# Patient Record
Sex: Female | Born: 1965 | Race: Black or African American | Hispanic: No | Smoking: Never smoker
Health system: Southern US, Community
[De-identification: ages and names within clinical notes are randomized; demographics above are authoritative.]

## PROBLEM LIST (undated history)

## (undated) DIAGNOSIS — K219 Gastro-esophageal reflux disease without esophagitis: Secondary | ICD-10-CM

## (undated) DIAGNOSIS — R0600 Dyspnea, unspecified: Secondary | ICD-10-CM

## (undated) DIAGNOSIS — R7303 Prediabetes: Secondary | ICD-10-CM

## (undated) DIAGNOSIS — M1990 Unspecified osteoarthritis, unspecified site: Secondary | ICD-10-CM

## (undated) DIAGNOSIS — F419 Anxiety disorder, unspecified: Secondary | ICD-10-CM

## (undated) DIAGNOSIS — J45909 Unspecified asthma, uncomplicated: Secondary | ICD-10-CM

## (undated) HISTORY — PX: DILATION AND CURETTAGE OF UTERUS: SHX78

## (undated) HISTORY — PX: TUBAL LIGATION: SHX77

## (undated) HISTORY — PX: APPENDECTOMY: SHX54

## (undated) HISTORY — PX: TOTAL HIP ARTHROPLASTY: SHX124

---

## 2002-03-26 ENCOUNTER — Encounter: Payer: Self-pay | Admitting: Emergency Medicine

## 2002-03-26 ENCOUNTER — Emergency Department (HOSPITAL_COMMUNITY): Admission: EM | Admit: 2002-03-26 | Discharge: 2002-03-26 | Payer: Self-pay | Admitting: Emergency Medicine

## 2002-04-26 ENCOUNTER — Ambulatory Visit (HOSPITAL_COMMUNITY): Admission: RE | Admit: 2002-04-26 | Discharge: 2002-04-26 | Payer: Self-pay | Admitting: Pulmonary Disease

## 2002-04-26 ENCOUNTER — Encounter: Payer: Self-pay | Admitting: Pulmonary Disease

## 2003-10-25 ENCOUNTER — Inpatient Hospital Stay (HOSPITAL_COMMUNITY): Admission: RE | Admit: 2003-10-25 | Discharge: 2003-10-29 | Payer: Self-pay | Admitting: Orthopedic Surgery

## 2004-07-12 ENCOUNTER — Encounter: Admission: RE | Admit: 2004-07-12 | Discharge: 2004-07-12 | Payer: Self-pay | Admitting: Orthopedic Surgery

## 2004-07-23 ENCOUNTER — Ambulatory Visit (HOSPITAL_COMMUNITY): Admission: RE | Admit: 2004-07-23 | Discharge: 2004-07-23 | Payer: Self-pay | Admitting: Orthopedic Surgery

## 2015-03-14 ENCOUNTER — Other Ambulatory Visit (HOSPITAL_COMMUNITY): Payer: Self-pay | Admitting: Orthopaedic Surgery

## 2015-03-14 DIAGNOSIS — M25552 Pain in left hip: Secondary | ICD-10-CM

## 2015-03-22 ENCOUNTER — Ambulatory Visit (HOSPITAL_COMMUNITY): Payer: BLUE CROSS/BLUE SHIELD

## 2015-03-22 ENCOUNTER — Encounter (HOSPITAL_COMMUNITY): Payer: BLUE CROSS/BLUE SHIELD

## 2015-03-31 ENCOUNTER — Ambulatory Visit (HOSPITAL_COMMUNITY): Admission: RE | Admit: 2015-03-31 | Payer: BLUE CROSS/BLUE SHIELD | Source: Ambulatory Visit

## 2015-03-31 ENCOUNTER — Ambulatory Visit (HOSPITAL_COMMUNITY): Payer: BLUE CROSS/BLUE SHIELD

## 2016-03-15 ENCOUNTER — Encounter (HOSPITAL_COMMUNITY): Payer: Self-pay | Admitting: Emergency Medicine

## 2016-03-15 ENCOUNTER — Emergency Department (HOSPITAL_COMMUNITY)
Admission: EM | Admit: 2016-03-15 | Discharge: 2016-03-15 | Disposition: A | Discharge status: Home / Self Care | Payer: BLUE CROSS/BLUE SHIELD | Attending: Emergency Medicine | Admitting: Emergency Medicine

## 2016-03-15 DIAGNOSIS — J3489 Other specified disorders of nose and nasal sinuses: Secondary | ICD-10-CM | POA: Insufficient documentation

## 2016-03-15 DIAGNOSIS — J45909 Unspecified asthma, uncomplicated: Secondary | ICD-10-CM | POA: Insufficient documentation

## 2016-03-15 HISTORY — DX: Unspecified asthma, uncomplicated: J45.909

## 2016-03-15 HISTORY — DX: Anxiety disorder, unspecified: F41.9

## 2016-03-15 MED ORDER — SULFAMETHOXAZOLE-TRIMETHOPRIM 800-160 MG PO TABS: 1.0000 | ORAL_TABLET | Freq: Two times a day (BID) | ORAL | Status: DC

## 2016-03-15 MED ORDER — SULFAMETHOXAZOLE-TRIMETHOPRIM 800-160 MG PO TABS
1.0000 | ORAL_TABLET | Freq: Once | ORAL | Status: AC
  Administered 2016-03-15: 1 via ORAL
  Filled 2016-03-15: qty 1

## 2016-03-15 MED ORDER — HYDROCODONE-ACETAMINOPHEN 5-325 MG PO TABS
1.0000 | ORAL_TABLET | Freq: Once | ORAL | Status: AC
  Administered 2016-03-15: 1 via ORAL
  Filled 2016-03-15: qty 1

## 2016-03-15 MED ORDER — MUPIROCIN CALCIUM 2 % EX CREA
TOPICAL_CREAM | Freq: Once | CUTANEOUS | Status: AC
  Administered 2016-03-15: 02:00:00 via TOPICAL
  Filled 2016-03-15: qty 15

## 2016-03-15 MED ORDER — MUPIROCIN CALCIUM 2 % EX CREA: TOPICAL_CREAM | Freq: Three times a day (TID) | CUTANEOUS | Status: DC

## 2016-03-15 MED ORDER — HYDROCODONE-ACETAMINOPHEN 5-325 MG PO TABS: 1.0000 | ORAL_TABLET | Freq: Four times a day (QID) | ORAL | Status: DC | PRN

## 2016-03-15 NOTE — Discharge Instructions (Signed)
Apply the Bactroban ointment to the inside of your right there 3 times a day.  Take the Bactrim as directed twice a day.  Please call ENT to make an appointment for follow-up

## 2016-03-15 NOTE — ED Provider Notes (Signed)
CSN: [id number]     Arrival date & time 03/15/16  0000 History  By signing my name below, I, [facility], attest that this documentation has been prepared under the direction and in the presence of Junius Creamer, NP. Electronically Signed: Virgel Bouquet, ED Scribe. 03/15/2016. 1:37 AM.    Chief Complaint  Patient presents with  . Facial Pain    The history is provided by the patient. No language interpreter was used.   HPI Comments: Karen Reese is a 50 y.o. female with a hx of asthma and anxiety who presents to the Emergency Department complaining of constant, mild, unchanging facial pain onset 2 weeks ago. Pt states that for the past month she has had pain, bleeding, and scaling skin to her nose for which she was evaluated by her PCP, diagnosed with dry mucosa, and advised to apply Vaseline which resolved her symptoms. She reports that she was asymptomatic until several weeks ago when she felt an insect fly up her left nare followed by a stinging pain and then by gradual facial pain in her nose, behind her bilateral eyes, and down to her upper lip. Pain is worse with palpation of the nose. She has applied warm compresses and taken OTC medications without relief. Denies fevers.   Past Medical History  Diagnosis Date  . Asthma   . Anxiety    Past Surgical History  Procedure Laterality Date  . Total hip arthroplasty    . Appendectomy    . Dilation and curettage of uterus    . Tubal ligation     Family History  Problem Relation Age of Onset  . Hypertension Other    Social History  Substance Use Topics  . Smoking status: Never Smoker   . Smokeless tobacco: None  . Alcohol Use: Yes     Comment: social   OB History    No data available     Review of Systems  Constitutional: Negative for fever.  Musculoskeletal: Positive for myalgias (facial pain).      Allergies  Review of patient's allergies indicates no known allergies.  Home Medications   Prior to  Admission medications   Medication Sig Start Date End Date Taking? Authorizing Provider  HYDROcodone-acetaminophen (NORCO/VICODIN) 5-325 MG tablet Take 1 tablet by mouth every 6 (six) hours as needed for moderate pain. 03/15/16   Junius Creamer, NP  mupirocin cream (BACTROBAN) 2 % Apply topically 3 (three) times daily. Inside R nare 03/15/16   Junius Creamer, NP  sulfamethoxazole-trimethoprim (BACTRIM DS,SEPTRA DS) 800-160 MG tablet Take 1 tablet by mouth 2 (two) times daily. 03/15/16   Junius Creamer, NP   BP 167/109 mmHg  Pulse 87  Temp(Src) 99.3 F (37.4 C) (Oral)  Resp 20  SpO2 100%  LMP  Physical Exam  Constitutional: She is oriented to person, place, and time. She appears well-developed and well-nourished. No distress.  HENT:  Head: Normocephalic and atraumatic.  3.5 x 5.5 mm swollen area in the right nare  Eyes: Conjunctivae are normal.  Neck: Normal range of motion.  Cardiovascular: Normal rate.   Pulmonary/Chest: Effort normal. No respiratory distress.  Musculoskeletal: Normal range of motion.  Neurological: She is alert and oriented to person, place, and time.  Skin: Skin is warm and dry.  Psychiatric: She has a normal mood and affect. Her behavior is normal.  Nursing note and vitals reviewed.   ED Course  .Marland KitchenIncision and Drainage Date/Time: 03/15/2016 2:05 AM Performed by: Junius Creamer Authorized by: Junius Creamer Consent:  Verbal consent obtained. Written consent not obtained. Risks and benefits: risks, benefits and alternatives were discussed Consent given by: patient Patient understanding: patient states understanding of the procedure being performed Patient identity confirmed: verbally with patient Time out: Immediately prior to procedure a "time out" was called to verify the correct patient, procedure, equipment, support staff and site/side marked as required. Type: cyst Location: nostril. Anesthetic total: 0 ml Patient sedated: no Needle gauge: 20 Complexity:  simple Drainage amount: scant Comments: Very small amount purulent fluid and blood      DIAGNOSTIC STUDIES: Oxygen Saturation is 100% on RA, normal by my interpretation.    COORDINATION OF CARE: 1:17 AM Will attempt to aspirate swollen area in right nare. Will prescribe antibiotic. Discussed treatment plan with pt at bedside and pt agreed to plan.  1:28 AM Returned to aspirate swollen area in right nare. Will order Bactroban, Bactrim, and Norco. Will provide pt with referral to an ENT specialist. Advised pt to follow-up with ENT specialist. Will discharge pt. We will start patient on Septra and Bactroban topically and have patient follow-up with ENT MDM   Final diagnoses:  Nasal cavity mass    I personally performed the services described in this documentation, which was scribed in my presence. The recorded information has been reviewed and is accurate.   Junius Creamer, NP 03/15/16 [place], MD 03/15/16 [phone]

## 2016-03-15 NOTE — ED Notes (Signed)
Pt states for the past month she has had nasal irritation  Pt states she was seen by her dr and was told her nose was probably dry and to use vasoline   Pt states then she was outside and something went up her nose  Pt states since she has had facial pain down into her teeth, in her ears, behind her eyes  Pt states she has used warm compresses and OTC meds

## 2016-04-10 ENCOUNTER — Ambulatory Visit: Payer: BLUE CROSS/BLUE SHIELD

## 2016-08-31 ENCOUNTER — Encounter (HOSPITAL_COMMUNITY): Payer: Self-pay | Admitting: Emergency Medicine

## 2016-08-31 ENCOUNTER — Emergency Department (HOSPITAL_COMMUNITY)
Admission: EM | Admit: 2016-08-31 | Discharge: 2016-08-31 | Disposition: A | Discharge status: Home / Self Care | Payer: Self-pay | Attending: Emergency Medicine | Admitting: Emergency Medicine

## 2016-08-31 ENCOUNTER — Emergency Department (HOSPITAL_COMMUNITY): Payer: Self-pay

## 2016-08-31 DIAGNOSIS — Z96649 Presence of unspecified artificial hip joint: Secondary | ICD-10-CM | POA: Insufficient documentation

## 2016-08-31 DIAGNOSIS — R059 Cough, unspecified: Secondary | ICD-10-CM

## 2016-08-31 DIAGNOSIS — J45909 Unspecified asthma, uncomplicated: Secondary | ICD-10-CM | POA: Insufficient documentation

## 2016-08-31 DIAGNOSIS — R05 Cough: Secondary | ICD-10-CM | POA: Insufficient documentation

## 2016-08-31 MED ORDER — ALBUTEROL SULFATE HFA 108 (90 BASE) MCG/ACT IN AERS
2.0000 | INHALATION_SPRAY | Freq: Once | RESPIRATORY_TRACT | Status: AC
  Administered 2016-08-31: 2 via RESPIRATORY_TRACT
  Filled 2016-08-31: qty 6.7

## 2016-08-31 MED ORDER — BENZONATATE 100 MG PO CAPS: 100.0000 mg | ORAL_CAPSULE | Freq: Three times a day (TID) | ORAL | 0 refills | Status: DC

## 2016-08-31 NOTE — Discharge Instructions (Signed)
Take the prescribed medication as directed. °Follow-up with your primary care doctor. °Return to the ED for new or worsening symptoms. °

## 2016-08-31 NOTE — ED Triage Notes (Addendum)
Patient c/o cough with greenish phlegm x week. patient has central chest pain with cough and deep breath. Patient also reports body aches and nasal congestion. Patient states that she is prone to getting bronchitis.

## 2016-08-31 NOTE — ED Provider Notes (Signed)
[facility] DEPT Provider Note   CSN: [id number] Arrival date & time: 08/31/16  1408  By signing my name below, I, Higinio Plan, attest that this documentation has been prepared under the direction and in the presence of Quincy Carnes, PA-C.  Electronically Signed: Higinio Plan, ED Scribe. 08/31/16. 2:42 PM.  History   Chief Complaint Chief Complaint  Patient presents with  . Cough  . Chest Pain  . Nasal Congestion   The history is provided by the patient. No language interpreter was used.   HPI Comments: CARESA KAMPMAN is a 50 y.o. female who presents to the Emergency Department complaining of gradually worsening, cough productive of green phlegm that began ~1 week ago. Pt reports associated subjective fever, nasal congestion, generalized body aches and central chest pain secondary to her cough. She notes her pain is exacerbated when taking a deep breath. 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 She denies hx of cardiac issues.   She is not a smoker.  Past Medical History:  Diagnosis Date  . Anxiety   . Asthma    There are no active problems to display for this patient.  Past Surgical History:  Procedure Laterality Date  . APPENDECTOMY    . DILATION AND CURETTAGE OF UTERUS    . TOTAL HIP ARTHROPLASTY    . TUBAL LIGATION      OB History    No data available     Home Medications    Prior to Admission medications   Medication Sig Start Date End Date Taking? Authorizing Provider  HYDROcodone-acetaminophen (NORCO/VICODIN) 5-325 MG tablet Take 1 tablet by mouth every 6 (six) hours as needed for moderate pain. 03/15/16   Junius Creamer, NP  mupirocin cream (BACTROBAN) 2 % Apply topically 3 (three) times daily. Inside R nare 03/15/16   Junius Creamer, NP  sulfamethoxazole-trimethoprim (BACTRIM  DS,SEPTRA DS) 800-160 MG tablet Take 1 tablet by mouth 2 (two) times daily. 03/15/16   Junius Creamer, NP    Family History Family History  Problem Relation Age of Onset  . Hypertension Other     Social History Social History  Substance Use Topics  . Smoking status: Never Smoker  . Smokeless tobacco: Never Used  . Alcohol use Yes     Comment: social     Allergies   Patient has no known allergies.   Review of Systems Review of Systems  Constitutional: Positive for fever.  HENT: Positive for congestion.   Respiratory: Positive for cough.   Cardiovascular: Positive for chest pain (secondary to cough).  Musculoskeletal: Positive for myalgias.  All other systems reviewed and are negative.  Physical Exam Updated Vital Signs BP 129/89 (BP Location: Left Arm)   Pulse 90   Temp 98.7 F (37.1 C) (Oral)   Resp 18   SpO2 98%   Physical Exam  Constitutional: She is oriented to person, place, and time. She appears well-developed and well-nourished.  HENT:  Head: Normocephalic and atraumatic.  Right Ear: Tympanic membrane normal.  Left Ear: Tympanic membrane normal.  Nose: Nose normal.  Mouth/Throat: Uvula is midline, oropharynx is clear and moist and mucous membranes are normal.  Eyes: Conjunctivae and EOM are normal. Pupils are equal, round, and reactive to light.  Neck: Normal range of motion.  Cardiovascular: Normal rate, regular rhythm and normal heart  sounds.   Pulmonary/Chest: Effort normal and breath sounds normal. She has no wheezes. She has no rhonchi. She has no rales.  Wet sounding cough noted on exam, no production, lungs overall clear without wheezes or rhonchi, no distress  Abdominal: Soft. Bowel sounds are normal.  Musculoskeletal: Normal range of motion.  Neurological: She is alert and oriented to person, place, and time.  Skin: Skin is warm and dry.  Psychiatric: She has a normal mood and affect.  Nursing note and vitals reviewed.  ED Treatments / Results    Labs (all labs ordered are listed, but only abnormal results are displayed) Labs Reviewed - No data to display  EKG  EKG Interpretation None       Radiology No results found.  Procedures Procedures (including critical care time)  Medications Ordered in ED Medications - No data to display  DIAGNOSTIC STUDIES:  Oxygen Saturation is 98% on RA, normal by my interpretation.    COORDINATION OF CARE:  2:38 PM Discussed treatment plan with pt at bedside and pt agreed to plan.  Initial Impression / Assessment and Plan / ED Course  I have reviewed the triage vital signs and the nursing notes.  Pertinent labs & imaging results that were available during my care of the patient were reviewed by me and considered in my medical decision making (see chart for details).  Clinical Course    50 year old female here with productive cough for the past week. Reports history of recurrent bronchitis. 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 She is afebrile and nontoxic. She does have a wet sounding cough on exam without production.  She does report some chest pain, but only with coughing. Denies shortness of breath. She has no known cardiac history, she is not a smoker.  Chest x-ray was obtained, no acute findings. Patient's vital signs have been stable. Feel this is likely viral process, possibly recurrent bronchitis. Feel ACS, PE, dissection, or other acute cardiac event is less likely. She was given albuterol inhaler here, discharge home with Tessalon Perles for cough. Recommended supportive care, follow-up with PCP.  Discussed plan with patient, she acknowledged understanding and agreed with plan of care.  Return precautions given for new or worsening symptoms.  I personally performed the services described in this documentation, which was scribed in my presence. The recorded information has been reviewed and is accurate.  Final Clinical Impressions(s) / ED  Diagnoses   Final diagnoses:  Cough    New Prescriptions Discharge Medication List as of 08/31/2016  3:53 PM    START taking these medications   Details  benzonatate (TESSALON) 100 MG capsule Take 1 capsule (100 mg total) by mouth every 8 (eight) hours., Starting Sat 08/31/2016, Print         NARCISSUS MACDERMOTT, PA-C 08/31/16 1614    Quintella Reichert, MD 09/03/16 [phone]

## 2017-02-04 ENCOUNTER — Encounter (HOSPITAL_COMMUNITY): Payer: Self-pay | Admitting: Emergency Medicine

## 2017-02-04 DIAGNOSIS — J45909 Unspecified asthma, uncomplicated: Secondary | ICD-10-CM | POA: Insufficient documentation

## 2017-02-04 DIAGNOSIS — Z96649 Presence of unspecified artificial hip joint: Secondary | ICD-10-CM | POA: Insufficient documentation

## 2017-02-04 DIAGNOSIS — Z79899 Other long term (current) drug therapy: Secondary | ICD-10-CM | POA: Insufficient documentation

## 2017-02-04 DIAGNOSIS — R490 Dysphonia: Secondary | ICD-10-CM | POA: Insufficient documentation

## 2017-02-04 NOTE — ED Triage Notes (Signed)
Pt states she has had a hoarse voice for the past couple months  Pt states she thought it was allergies so she used some benadryl  Pt states it seems like it is getting better but then it comes back

## 2017-02-05 ENCOUNTER — Emergency Department (HOSPITAL_COMMUNITY)
Admission: EM | Admit: 2017-02-05 | Discharge: 2017-02-05 | Disposition: A | Discharge status: Home / Self Care | Payer: BLUE CROSS/BLUE SHIELD | Attending: Emergency Medicine | Admitting: Emergency Medicine

## 2017-02-05 DIAGNOSIS — R490 Dysphonia: Secondary | ICD-10-CM

## 2017-02-05 HISTORY — DX: Prediabetes: R73.03

## 2017-02-05 NOTE — ED Provider Notes (Signed)
[name]   CSN: [id number] Arrival date & time: 02/04/17  2336     History   Chief Complaint Chief Complaint  Patient presents with  . Hoarse    HPI Karen Reese is a 51 y.o. female.  The history is provided by the patient and medical records. No language interpreter was used.   Karen Reese is a 51 y.o. female  with a PMH of asthma, prediabetes and anxiety who presents to the Emergency Department complaining of hoarseness x 4 months. Patient states this has been a persistent problem. Will wax-and-wane but has not ever resolved. Initially thought it was her allergies. She tried benadryl but with no relief. No cough, congestion, fever, chills. No dysphagia or sore throat. No shortness of breath. No alleviating or aggravating factors noted.   Past Medical History:  Diagnosis Date  . Anxiety   . Asthma   . Prediabetes     There are no active problems to display for this patient.   Past Surgical History:  Procedure Laterality Date  . APPENDECTOMY    . DILATION AND CURETTAGE OF UTERUS    . TOTAL HIP ARTHROPLASTY    . TUBAL LIGATION      OB History    No data available       Home Medications    Prior to Admission medications   Medication Sig Start Date End Date Taking? Authorizing Provider  benzonatate (TESSALON) 100 MG capsule Take 1 capsule (100 mg total) by mouth every 8 (eight) hours. 08/31/16   Larene Pickett, PA-C  HYDROcodone-acetaminophen (NORCO/VICODIN) 5-325 MG tablet Take 1 tablet by mouth every 6 (six) hours as needed for moderate pain. 03/15/16   Junius Creamer, NP  mupirocin cream (BACTROBAN) 2 % Apply topically 3 (three) times daily. Inside R nare 03/15/16   Junius Creamer, NP  sulfamethoxazole-trimethoprim (BACTRIM DS,SEPTRA DS) 800-160 MG tablet Take 1 tablet by mouth 2 (two) times daily. 03/15/16   Junius Creamer, NP    Family History Family History  Problem Relation Age of Onset  . Hypertension Other     Social  History Social History  Substance Use Topics  . Smoking status: Never Smoker  . Smokeless tobacco: Never Used  . Alcohol use Yes     Comment: social     Allergies   Bee venom   Review of Systems Review of Systems  Constitutional: Negative for chills and fever.  HENT: Positive for voice change. Negative for congestion, sneezing, sore throat and trouble swallowing.        + hoarseness  Eyes: Negative for discharge and itching.  Respiratory: Negative for cough and shortness of breath.   Cardiovascular: Negative for chest pain.     Physical Exam Updated Vital Signs BP 132/82 (BP Location: Left Arm)   Pulse 76   Temp 98.7 F (37.1 C) (Oral)   Resp 20   LMP 01/21/2017 (Approximate)   SpO2 100%   Physical Exam  Constitutional: She appears well-developed and well-nourished. No distress.  HENT:  Head: Normocephalic and atraumatic.  Nose: Nose normal.  Mouth/Throat: Oropharynx is clear and moist.  OP with no erythema, tonsillar hypertrophy or exudates. No sinus tenderness.  Neck: Neck supple. No tracheal deviation present. No thyromegaly present.  Cardiovascular: Normal rate, regular rhythm and normal heart sounds.   No murmur heard. Pulmonary/Chest: Effort normal and breath sounds normal. No respiratory distress. She has no wheezes. She has no rales.  Musculoskeletal: Normal range of motion.  Neurological: She is alert.  Skin: Skin is warm and dry.  Nursing note and vitals reviewed.    ED Treatments / Results  Labs (all labs ordered are listed, but only abnormal results are displayed) Labs Reviewed - No data to display  EKG  EKG Interpretation None       Radiology No results found.  Procedures Procedures (including critical care time)  Medications Ordered in ED Medications - No data to display   Initial Impression / Assessment and Plan / ED Course  I have reviewed the triage vital signs and the nursing notes.  Pertinent labs & imaging results that  were available during my care of the patient were reviewed by me and considered in my medical decision making (see chart for details).     Karen Reese is a 51 y.o. female who presents to ED for hoarseness x 4 months. No other associated symptoms. Benign exam with normal thyroid and OP. No neck tenderness and neck with full ROM. Evaluation does not show pathology that would require ongoing emergent intervention or inpatient treatment. Patient referred to ENT for further workup. Understands reasons to return including inability to tolerate fluids, dysphagia, fevers, new/worsening symptoms or additional concerns. All questions answered.   Patient discussed with Dr. Christy Gentles who agrees with treatment plan.    Final Clinical Impressions(s) / ED Diagnoses   Final diagnoses:  Hoarseness    New Prescriptions Discharge Medication List as of 02/05/2017  1:25 AM       Chaniyah Jahr, Ozella Almond, PA-C 02/05/17 1652    Ripley Fraise, MD 02/05/17 2219

## 2017-02-05 NOTE — Discharge Instructions (Signed)
It was my pleasure taking care of you today!   Please call the ENT (ear, nose, throat) doctor listed in the morning to schedule a follow up appointment.   Return to ER for painful swallowing, unable to keep fluids down, fevers, new or worsening symptoms, any additional concerns.

## 2017-05-10 ENCOUNTER — Emergency Department (HOSPITAL_COMMUNITY): Payer: BLUE CROSS/BLUE SHIELD

## 2017-05-10 ENCOUNTER — Emergency Department (HOSPITAL_COMMUNITY)
Admission: EM | Admit: 2017-05-10 | Discharge: 2017-05-10 | Disposition: A | Discharge status: Home / Self Care | Payer: BLUE CROSS/BLUE SHIELD | Attending: Emergency Medicine | Admitting: Emergency Medicine

## 2017-05-10 ENCOUNTER — Encounter (HOSPITAL_COMMUNITY): Payer: Self-pay | Admitting: Emergency Medicine

## 2017-05-10 DIAGNOSIS — J45909 Unspecified asthma, uncomplicated: Secondary | ICD-10-CM | POA: Insufficient documentation

## 2017-05-10 DIAGNOSIS — E876 Hypokalemia: Secondary | ICD-10-CM

## 2017-05-10 DIAGNOSIS — K529 Noninfective gastroenteritis and colitis, unspecified: Secondary | ICD-10-CM | POA: Insufficient documentation

## 2017-05-10 DIAGNOSIS — K6389 Other specified diseases of intestine: Secondary | ICD-10-CM

## 2017-05-10 DIAGNOSIS — R1084 Generalized abdominal pain: Secondary | ICD-10-CM

## 2017-05-10 DIAGNOSIS — R110 Nausea: Secondary | ICD-10-CM

## 2017-05-10 DIAGNOSIS — Z96649 Presence of unspecified artificial hip joint: Secondary | ICD-10-CM | POA: Insufficient documentation

## 2017-05-10 DIAGNOSIS — Z79899 Other long term (current) drug therapy: Secondary | ICD-10-CM | POA: Insufficient documentation

## 2017-05-10 DIAGNOSIS — D259 Leiomyoma of uterus, unspecified: Secondary | ICD-10-CM

## 2017-05-10 LAB — CBC
HCT: 34.9 % — ABNORMAL LOW (ref 36.0–46.0)
Hemoglobin: 11.8 g/dL — ABNORMAL LOW (ref 12.0–15.0)
MCH: 30.9 pg (ref 26.0–34.0)
MCHC: 33.8 g/dL (ref 30.0–36.0)
MCV: 91.4 fL (ref 78.0–100.0)
Platelets: 289 10*3/uL (ref 150–400)
RBC: 3.82 MIL/uL — ABNORMAL LOW (ref 3.87–5.11)
RDW: 14.4 % (ref 11.5–15.5)
WBC: 11.4 10*3/uL — ABNORMAL HIGH (ref 4.0–10.5)

## 2017-05-10 LAB — COMPREHENSIVE METABOLIC PANEL
ALT: 15 U/L (ref 14–54)
AST: 17 U/L (ref 15–41)
Albumin: 3.4 g/dL — ABNORMAL LOW (ref 3.5–5.0)
Alkaline Phosphatase: 42 U/L (ref 38–126)
Anion gap: 7 (ref 5–15)
BUN: 10 mg/dL (ref 6–20)
CO2: 25 mmol/L (ref 22–32)
CREATININE: 0.78 mg/dL (ref 0.44–1.00)
Calcium: 8.7 mg/dL — ABNORMAL LOW (ref 8.9–10.3)
Chloride: 105 mmol/L (ref 101–111)
GFR calc Af Amer: >60 mL/min (ref >60)
GFR calc non Af Amer: >60 mL/min (ref >60)
Glucose, Bld: 93 mg/dL (ref 65–99)
Potassium: 3.1 mmol/L — ABNORMAL LOW (ref 3.5–5.1)
Sodium: 137 mmol/L (ref 135–145)
Total Bilirubin: 0.7 mg/dL (ref 0.3–1.2)
Total Protein: 7.8 g/dL (ref 6.5–8.1)

## 2017-05-10 LAB — URINALYSIS, ROUTINE W REFLEX MICROSCOPIC
BACTERIA UA: NONE SEEN
Bilirubin Urine: NEGATIVE
Glucose, UA: NEGATIVE mg/dL
Ketones, ur: NEGATIVE mg/dL
LEUKOCYTES UA: NEGATIVE
Nitrite: NEGATIVE
Protein, ur: NEGATIVE mg/dL
Specific Gravity, Urine: 1.027 (ref 1.005–1.030)
pH: 5 (ref 5.0–8.0)

## 2017-05-10 LAB — POC URINE PREG, ED: Preg Test, Ur: NEGATIVE

## 2017-05-10 LAB — LIPASE, BLOOD: Lipase: 22 U/L (ref 11–51)

## 2017-05-10 MED ORDER — PROMETHAZINE HCL 25 MG PO TABS: 25.0000 mg | ORAL_TABLET | Freq: Four times a day (QID) | ORAL | 0 refills | Status: DC | PRN

## 2017-05-10 MED ORDER — HYDROCODONE-ACETAMINOPHEN 5-325 MG PO TABS: 1.0000 | ORAL_TABLET | ORAL | 0 refills | Status: DC | PRN

## 2017-05-10 MED ORDER — ONDANSETRON HCL 4 MG/2ML IJ SOLN
4.0000 mg | Freq: Once | INTRAMUSCULAR | Status: AC
  Administered 2017-05-10: 4 mg via INTRAVENOUS
  Filled 2017-05-10: qty 2

## 2017-05-10 MED ORDER — POTASSIUM CHLORIDE CRYS ER 20 MEQ PO TBCR
20.0000 meq | EXTENDED_RELEASE_TABLET | Freq: Once | ORAL | Status: AC
  Administered 2017-05-10: 20 meq via ORAL
  Filled 2017-05-10: qty 1

## 2017-05-10 MED ORDER — SODIUM CHLORIDE 0.9 % IV BOLUS (SEPSIS)
1000.0000 mL | Freq: Once | INTRAVENOUS | Status: AC
  Administered 2017-05-10: 1000 mL via INTRAVENOUS

## 2017-05-10 MED ORDER — IOPAMIDOL (ISOVUE-300) INJECTION 61%
INTRAVENOUS | Status: AC
  Administered 2017-05-10: 100 mL via INTRAVENOUS
  Filled 2017-05-10: qty 100

## 2017-05-10 MED ORDER — FAMOTIDINE 20 MG PO TABS: 20.0000 mg | ORAL_TABLET | Freq: Two times a day (BID) | ORAL | 0 refills | Status: AC

## 2017-05-10 MED ORDER — MORPHINE SULFATE (PF) 4 MG/ML IV SOLN
4.0000 mg | Freq: Once | INTRAVENOUS | Status: AC
  Administered 2017-05-10: 4 mg via INTRAVENOUS
  Filled 2017-05-10: qty 1

## 2017-05-10 NOTE — ED Provider Notes (Signed)
[facility] DEPT Provider Note   CSN: [id number] Arrival date & time: 05/10/17  1527     History   Chief Complaint Chief Complaint  Patient presents with  . Abdominal Pain    HPI Karen Reese is a 51 y.o. female.  Pt presents to the ED today with abdominal pain.  The pt said it is mainly in the LLQ, but is also in her epigastrium.  Sx have been going on for about 1 week.  Pt said that when the pain hits, she breaks out in a sweat.  No fevers.  She has had nausea, but no vomiting.      Past Medical History:  Diagnosis Date  . Anxiety   . Asthma   . Prediabetes     There are no active problems to display for this patient.   Past Surgical History:  Procedure Laterality Date  . APPENDECTOMY    . DILATION AND CURETTAGE OF UTERUS    . TOTAL HIP ARTHROPLASTY    . TUBAL LIGATION      OB History    No data available       Home Medications    Prior to Admission medications   Medication Sig Start Date End Date Taking? Authorizing Provider  benzonatate (TESSALON) 100 MG capsule Take 1 capsule (100 mg total) by mouth every 8 (eight) hours. 08/31/16   Larene Pickett, PA-C  famotidine (PEPCID) 20 MG tablet Take 1 tablet (20 mg total) by mouth 2 (two) times daily. 05/10/17   Isla Pence, MD  HYDROcodone-acetaminophen (NORCO/VICODIN) 5-325 MG tablet Take 1 tablet by mouth every 4 (four) hours as needed. 05/10/17   Isla Pence, MD  mupirocin cream (BACTROBAN) 2 % Apply topically 3 (three) times daily. Inside R nare 03/15/16   Junius Creamer, NP  promethazine (PHENERGAN) 25 MG tablet Take 1 tablet (25 mg total) by mouth every 6 (six) hours as needed for nausea or vomiting. 05/10/17   Isla Pence, MD  sulfamethoxazole-trimethoprim (BACTRIM DS,SEPTRA DS) 800-160 MG tablet Take 1 tablet by mouth 2 (two) times daily. 03/15/16   Junius Creamer, NP    Family History Family History  Problem Relation Age of Onset  . Hypertension Other     Social History Social History    Substance Use Topics  . Smoking status: Never Smoker  . Smokeless tobacco: Never Used  . Alcohol use Yes     Comment: social     Allergies   Bee venom   Review of Systems Review of Systems  Gastrointestinal: Positive for abdominal pain and nausea.  All other systems reviewed and are negative.    Physical Exam Updated Vital Signs BP (!) 142/88 (BP Location: Right Arm)   Pulse 82   Temp 98.3 F (36.8 C) (Oral)   Resp 16   SpO2 100%   Physical Exam  Constitutional: She is oriented to person, place, and time. She appears well-developed and well-nourished.  HENT:  Head: Normocephalic and atraumatic.  Right Ear: External ear normal.  Left Ear: External ear normal.  Nose: Nose normal.  Mouth/Throat: Oropharynx is clear and moist.  Eyes: Pupils are equal, round, and reactive to light. Conjunctivae and EOM are normal.  Neck: Normal range of motion. Neck supple.  Cardiovascular: Normal rate, regular rhythm, normal heart sounds and intact distal pulses.   Pulmonary/Chest: Effort normal and breath sounds normal.  Abdominal: Soft. Bowel sounds are normal. There is tenderness in the left lower quadrant.  Musculoskeletal: Normal range of motion.  Neurological: She is alert and oriented to person, place, and time.  Skin: Skin is warm.  Psychiatric: She has a normal mood and affect. Her behavior is normal. Judgment and thought content normal.  Nursing note and vitals reviewed.    ED Treatments / Results  Labs (all labs ordered are listed, but only abnormal results are displayed) Labs Reviewed  COMPREHENSIVE METABOLIC PANEL - Abnormal; Notable for the following:       Result Value   Potassium 3.1 (*)    Calcium 8.7 (*)    Albumin 3.4 (*)    All other components within normal limits  CBC - Abnormal; Notable for the following:    WBC 11.4 (*)    RBC 3.82 (*)    Hemoglobin 11.8 (*)    HCT 34.9 (*)    All other components within normal limits  URINALYSIS, ROUTINE W REFLEX  MICROSCOPIC - Abnormal; Notable for the following:    APPearance HAZY (*)    Hgb urine dipstick LARGE (*)    Squamous Epithelial / LPF 0-5 (*)    All other components within normal limits  LIPASE, BLOOD  POC URINE PREG, ED    EKG  EKG Interpretation None       Radiology Ct Abdomen Pelvis W Contrast  Result Date: 05/10/2017 CLINICAL DATA:  51 year old female with history of pain in the left lower quadrant for the past 2 weeks progressively worsening, now with generalized abdominal pain for the past 6 days. Nausea and sweating at home. EXAM: CT ABDOMEN AND PELVIS WITH CONTRAST TECHNIQUE: Multidetector CT imaging of the abdomen and pelvis was performed using the standard protocol following bolus administration of intravenous contrast. CONTRAST:  <See Chart> ISOVUE-300 IOPAMIDOL (ISOVUE-300) INJECTION 61% COMPARISON:  No priors. FINDINGS: Lower chest: Unremarkable. Hepatobiliary: No cystic or solid hepatic lesions. No intra or extrahepatic biliary ductal dilatation. Gallbladder is normal in appearance. Pancreas: No pancreatic mass. No pancreatic ductal dilatation. No pancreatic or peripancreatic fluid or inflammatory changes. Spleen: Unremarkable. Adrenals/Urinary Tract: Bilateral kidneys and bilateral adrenal glands are normal in appearance. No hydroureteronephrosis. Urinary bladder is normal in appearance. Stomach/Bowel: Normal appearance of the stomach. No pathologic dilatation of small bowel or colon. Adjacent to the proximal sigmoid colon there are some focal fatty attenuation areas surrounded by inflammatory changes, favored to represent epiploic appendagitis. Status post appendectomy. Vascular/Lymphatic: No significant atherosclerotic disease, aneurysm or dissection noted in the abdominal or pelvic vasculature. No lymphadenopathy noted in the abdomen or pelvis. Reproductive: 5.1 cm soft tissue mass extending from the dorsal aspect of the body of the uterus is most compatible with a large  fibroid. Ovaries are unremarkable in appearance. Other: No significant volume of ascites.  No pneumoperitoneum. Musculoskeletal: There are no aggressive appearing lytic or blastic lesions noted in the visualized portions of the skeleton. Status post left hip arthroplasty. IMPRESSION: 1. Epiploic appendagitis involving the proximal sigmoid mesocolon. 2. Additional incidental findings, as above. Electronically Signed   By: Vinnie Langton M.D.   On: 05/10/2017 20:23    Procedures Procedures (including critical care time)  Medications Ordered in ED Medications  potassium chloride SA (K-DUR,KLOR-CON) CR tablet 20 mEq (not administered)  sodium chloride 0.9 % bolus 1,000 mL (1,000 mLs Intravenous New Bag/Given 05/10/17 1934)  ondansetron (ZOFRAN) injection 4 mg (4 mg Intravenous Given 05/10/17 1934)  morphine 4 MG/ML injection 4 mg (4 mg Intravenous Given 05/10/17 1934)  iopamidol (ISOVUE-300) 61 % injection (100 mLs Intravenous Contrast Given 05/10/17 1952)     Initial Impression /  Assessment and Plan / ED Course  I have reviewed the triage vital signs and the nursing notes.  Pertinent labs & imaging results that were available during my care of the patient were reviewed by me and considered in my medical decision making (see chart for details).    Pt denies any heavy vaginal bleeding and was unaware she had a fibroid.  Pt is feeling much better.  She knows to return if worse.  F/u with pcp and with gi.  Final Clinical Impressions(s) / ED Diagnoses   Final diagnoses:  Generalized abdominal pain  Nausea  Uterine leiomyoma, unspecified location  Epiploic appendagitis  Hypokalemia    New Prescriptions New Prescriptions   FAMOTIDINE (PEPCID) 20 MG TABLET    Take 1 tablet (20 mg total) by mouth 2 (two) times daily.   HYDROCODONE-ACETAMINOPHEN (NORCO/VICODIN) 5-325 MG TABLET    Take 1 tablet by mouth every 4 (four) hours as needed.   PROMETHAZINE (PHENERGAN) 25 MG TABLET    Take 1 tablet (25 mg  total) by mouth every 6 (six) hours as needed for nausea or vomiting.     Isla Pence, MD 05/10/17 2038

## 2017-05-10 NOTE — ED Triage Notes (Signed)
Pt reports generalized abd pain for the past 6 days. No v/d. Has had nausea and sweating at home.

## 2017-07-15 ENCOUNTER — Emergency Department (HOSPITAL_COMMUNITY): Payer: Self-pay

## 2017-07-15 ENCOUNTER — Emergency Department (HOSPITAL_COMMUNITY)
Admission: EM | Admit: 2017-07-15 | Discharge: 2017-07-15 | Disposition: A | Discharge status: Home / Self Care | Payer: Self-pay | Attending: Emergency Medicine | Admitting: Emergency Medicine

## 2017-07-15 DIAGNOSIS — K529 Noninfective gastroenteritis and colitis, unspecified: Secondary | ICD-10-CM | POA: Insufficient documentation

## 2017-07-15 DIAGNOSIS — J45909 Unspecified asthma, uncomplicated: Secondary | ICD-10-CM | POA: Insufficient documentation

## 2017-07-15 LAB — CBC WITH DIFFERENTIAL/PLATELET
BASOS ABS: 0 10*3/uL (ref 0.0–0.1)
BASOS PCT: 0 %
EOS ABS: 0 10*3/uL (ref 0.0–0.7)
Eosinophils Relative: 0 %
HCT: 34.9 % — ABNORMAL LOW (ref 36.0–46.0)
Hemoglobin: 11.7 g/dL — ABNORMAL LOW (ref 12.0–15.0)
Lymphocytes Relative: 20 %
Lymphs Abs: 1.9 10*3/uL (ref 0.7–4.0)
MCH: 30.5 pg (ref 26.0–34.0)
MCHC: 33.5 g/dL (ref 30.0–36.0)
MCV: 91.1 fL (ref 78.0–100.0)
MONOS PCT: 5 %
Monocytes Absolute: 0.5 10*3/uL (ref 0.1–1.0)
NEUTROS PCT: 75 %
Neutro Abs: 7.1 10*3/uL (ref 1.7–7.7)
Platelets: 276 10*3/uL (ref 150–400)
RBC: 3.83 MIL/uL — ABNORMAL LOW (ref 3.87–5.11)
RDW: 14.4 % (ref 11.5–15.5)
WBC: 9.6 10*3/uL (ref 4.0–10.5)

## 2017-07-15 LAB — URINALYSIS, ROUTINE W REFLEX MICROSCOPIC
BACTERIA UA: NONE SEEN
BILIRUBIN URINE: NEGATIVE
Glucose, UA: NEGATIVE mg/dL
KETONES UR: NEGATIVE mg/dL
LEUKOCYTES UA: NEGATIVE
Nitrite: NEGATIVE
PH: 7 (ref 5.0–8.0)
PROTEIN: NEGATIVE mg/dL
Specific Gravity, Urine: 1.009 (ref 1.005–1.030)

## 2017-07-15 LAB — I-STAT BETA HCG BLOOD, ED (MC, WL, AP ONLY): I-stat hCG, quantitative: <5 m[IU]/mL (ref <5)

## 2017-07-15 LAB — COMPREHENSIVE METABOLIC PANEL
ALBUMIN: 3.2 g/dL — AB (ref 3.5–5.0)
ALK PHOS: 44 U/L (ref 38–126)
ALT: 18 U/L (ref 14–54)
ANION GAP: 5 (ref 5–15)
AST: 28 U/L (ref 15–41)
BUN: 10 mg/dL (ref 6–20)
CO2: 23 mmol/L (ref 22–32)
Calcium: 8.2 mg/dL — ABNORMAL LOW (ref 8.9–10.3)
Chloride: 109 mmol/L (ref 101–111)
Creatinine, Ser: 0.73 mg/dL (ref 0.44–1.00)
GFR calc Af Amer: >60 mL/min (ref >60)
GFR calc non Af Amer: >60 mL/min (ref >60)
GLUCOSE: 113 mg/dL — AB (ref 65–99)
POTASSIUM: 5.3 mmol/L — AB (ref 3.5–5.1)
SODIUM: 137 mmol/L (ref 135–145)
Total Bilirubin: 1.4 mg/dL — ABNORMAL HIGH (ref 0.3–1.2)
Total Protein: 6.8 g/dL (ref 6.5–8.1)

## 2017-07-15 LAB — LIPASE, BLOOD: Lipase: 27 U/L (ref 11–51)

## 2017-07-15 MED ORDER — IOPAMIDOL (ISOVUE-300) INJECTION 61%
INTRAVENOUS | Status: DC
Start: 2017-07-15 — End: 2017-07-15
  Filled 2017-07-15: qty 100

## 2017-07-15 MED ORDER — SODIUM CHLORIDE 0.9 % IV BOLUS (SEPSIS)
1000.0000 mL | Freq: Once | INTRAVENOUS | Status: AC
  Administered 2017-07-15: 1000 mL via INTRAVENOUS

## 2017-07-15 MED ORDER — ONDANSETRON HCL 4 MG/2ML IJ SOLN
4.0000 mg | Freq: Once | INTRAMUSCULAR | Status: AC
  Administered 2017-07-15: 4 mg via INTRAVENOUS
  Filled 2017-07-15: qty 2

## 2017-07-15 MED ORDER — MORPHINE SULFATE (PF) 4 MG/ML IV SOLN
4.0000 mg | Freq: Once | INTRAVENOUS | Status: AC
  Administered 2017-07-15: 4 mg via INTRAVENOUS
  Filled 2017-07-15: qty 1

## 2017-07-15 MED ORDER — IOPAMIDOL (ISOVUE-300) INJECTION 61%
100.0000 mL | Freq: Once | INTRAVENOUS | Status: AC | PRN
  Administered 2017-07-15: 100 mL via INTRAVENOUS

## 2017-07-15 MED ORDER — ONDANSETRON 4 MG PO TBDP: ORAL_TABLET | ORAL | 0 refills | Status: DC

## 2017-07-15 NOTE — ED Notes (Signed)
ED Provider at bedside. 

## 2017-07-15 NOTE — ED Notes (Signed)
Pt ambulated to the restroom with no assistance.

## 2017-07-15 NOTE — Discharge Instructions (Signed)
Stay hydrated.   Take zofran as needed for nausea.   Take imodium up to 10 times daily for diarrhea.   See your doctor  Return to ER if you have worse abdominal pain, vomiting, fever, dehydration

## 2017-07-15 NOTE — ED Provider Notes (Signed)
[facility] DEPT Provider Note   CSN: [id number] Arrival date & time: 07/15/17  0759     History   Chief Complaint Chief Complaint  Patient presents with  . Abdominal Pain    HPI Karen Reese is a 51 y.o. female history of asthma here presenting with abdominal pain, nausea, loose stools.  Patient had acute onset of periumbilical pain that woke her up from sleep that radiated to her flank this morning.  Patient states that the pain is very severe.  Associated with some nausea and some loose stools.  Patient denies any recent travel or eating uncooked food or fevers.  Patient denies any sick contacts.  Patient was seen here several months ago with similar symptoms and was diagnosed with epiploic appendagitis and fibroids. No vaginal bleeding or urinary symptoms.   The history is provided by the patient.    Past Medical History:  Diagnosis Date  . Anxiety   . Asthma   . Prediabetes     There are no active problems to display for this patient.   Past Surgical History:  Procedure Laterality Date  . APPENDECTOMY    . DILATION AND CURETTAGE OF UTERUS    . TOTAL HIP ARTHROPLASTY    . TUBAL LIGATION      OB History    No data available       Home Medications    Prior to Admission medications   Medication Sig Start Date End Date Taking? Authorizing Provider  albuterol (PROVENTIL HFA;VENTOLIN HFA) 108 (90 Base) MCG/ACT inhaler Inhale 1-2 puffs every 6 (six) hours as needed into the lungs for wheezing or shortness of breath.   Yes [provider]  LORazepam (ATIVAN) 0.5 MG tablet Take 0.5 mg 2 (two) times daily as needed by mouth for anxiety.   Yes [provider]  naproxen sodium (ALEVE) 220 MG tablet Take 660 mg daily as needed by mouth (for pain).   Yes [provider]  famotidine (PEPCID) 20 MG tablet Take 1 tablet (20 mg total) by mouth 2 (two) times daily. Patient not taking: Reported on 07/15/2017 05/10/17    Isla Pence, MD  HYDROcodone-acetaminophen (NORCO/VICODIN) 5-325 MG tablet Take 1 tablet by mouth every 4 (four) hours as needed. Patient not taking: Reported on 07/15/2017 05/10/17   Isla Pence, MD  promethazine (PHENERGAN) 25 MG tablet Take 1 tablet (25 mg total) by mouth every 6 (six) hours as needed for nausea or vomiting. Patient not taking: Reported on 07/15/2017 05/10/17   Isla Pence, MD    Family History Family History  Problem Relation Age of Onset  . Hypertension Other     Social History Social History   Tobacco Use  . Smoking status: Never Smoker  . Smokeless tobacco: Never Used  Substance Use Topics  . Alcohol use: Yes    Comment: social  . Drug use: No     Allergies   Bee venom; [employer] systems engineer; and Monosodium glutamate   Review of Systems Review of Systems  Gastrointestinal: Positive for abdominal pain and nausea.  All other systems reviewed and are negative.    Physical Exam Updated Vital Signs BP 106/77 (BP Location: Right Arm)   Pulse 92   Temp 98 F (36.7 C) (Oral)   Resp 16   SpO2 100%   Physical Exam  Constitutional: She is oriented to person, place, and time.  Slightly uncomfortable   HENT:  Head: Normocephalic.  MM slightly dry   Eyes: Conjunctivae and  EOM are normal. Pupils are equal, round, and reactive to light.  Neck: Normal range of motion. Neck supple.  Cardiovascular: Normal rate, regular rhythm and normal heart sounds.  Pulmonary/Chest: Effort normal and breath sounds normal. No stridor. No respiratory distress. She has no wheezes.  Abdominal: Soft. Bowel sounds are normal.  Mild periumbilical tenderness. No CVAT   Musculoskeletal: Normal range of motion.  Neurological: She is alert and oriented to person, place, and time. No cranial nerve deficit. Coordination normal.  Skin: Skin is warm.  Psychiatric: She has a normal mood and affect.  Nursing note and vitals reviewed.    ED Treatments / Results  Labs (all labs  ordered are listed, but only abnormal results are displayed) Labs Reviewed  URINALYSIS, ROUTINE W REFLEX MICROSCOPIC - Abnormal; Notable for the following components:      Result Value   Hgb urine dipstick MODERATE (*)    Squamous Epithelial / LPF 0-5 (*)    All other components within normal limits  CBC WITH DIFFERENTIAL/PLATELET - Abnormal; Notable for the following components:   RBC 3.83 (*)    Hemoglobin 11.7 (*)    HCT 34.9 (*)    All other components within normal limits  COMPREHENSIVE METABOLIC PANEL - Abnormal; Notable for the following components:   Potassium 5.3 (*)    Glucose, Bld 113 (*)    Calcium 8.2 (*)    Albumin 3.2 (*)    Total Bilirubin 1.4 (*)    All other components within normal limits  LIPASE, BLOOD  CBC WITH DIFFERENTIAL/PLATELET  I-STAT BETA HCG BLOOD, ED ([facility], [facility], [facility] ONLY)    EKG  EKG Interpretation None       Radiology Ct Abdomen Pelvis W Contrast  Result Date: 07/15/2017 CLINICAL DATA:  Abdominal pain EXAM: CT ABDOMEN AND PELVIS WITH CONTRAST TECHNIQUE: Multidetector CT imaging of the abdomen and pelvis was performed using the standard protocol following bolus administration of intravenous contrast. CONTRAST:  142mL ISOVUE-300 IOPAMIDOL (ISOVUE-300) INJECTION 61% COMPARISON:  05/10/2017 FINDINGS: Lower chest: No acute abnormality. Hepatobiliary: No focal liver abnormality is seen. No gallstones, gallbladder wall thickening, or biliary dilatation. Pancreas: Unremarkable. No pancreatic ductal dilatation or surrounding inflammatory changes. Spleen: Normal in size without focal abnormality. Adrenals/Urinary Tract: The adrenal glands are within normal limits. The kidneys are well visualized bilaterally. No renal calculi or obstructive changes are noted. Bladder is partially distended. Stomach/Bowel: The appendix has been surgically removed. The previously seen changes of epiploic appendagitis have improved although some very minimal changes remain. No new  inflammatory or obstructive changes are noted. Vascular/Lymphatic: No significant vascular findings are present. No enlarged abdominal or pelvic lymph nodes. Reproductive: Large uterine fibroid is noted measuring 5.5 cm. No adnexal mass is seen. Other: No abdominal wall hernia or abnormality. No abdominopelvic ascites. Musculoskeletal: Changes of prior left hip replacement are noted. Degenerative changes of the lumbar spine are seen. IMPRESSION: Previously seen epiploic appendagitis has nearly completely resolved. No new inflammatory changes are identified. Stable uterine fibroid change. No other focal abnormality is noted. Electronically Signed   By: Inez Catalina M.D.   On: 07/15/2017 10:24    Procedures Procedures (including critical care time)  Medications Ordered in ED Medications  iopamidol (ISOVUE-300) 61 % injection (not administered)  sodium chloride 0.9 % bolus 1,000 mL (0 mLs Intravenous Stopped 07/15/17 1025)  morphine 4 MG/ML injection 4 mg (4 mg Intravenous Given 07/15/17 0831)  ondansetron (ZOFRAN) injection 4 mg (4 mg Intravenous Given 07/15/17 0831)  iopamidol (ISOVUE-300)  61 % injection 100 mL (100 mLs Intravenous Contrast Given 07/15/17 1003)     Initial Impression / Assessment and Plan / ED Course  I have reviewed the triage vital signs and the nursing notes.  Pertinent labs & imaging results that were available during my care of the patient were reviewed by me and considered in my medical decision making (see chart for details).    Karen Reese is a 51 y.o. female here with abdominal pain. Had previous appendectomy. Consider SBO vs colitis vs pain from fibroids. Will get labs, UA, CT ab/pel.   10:49 AM WBC nl. UA nl. CT showed stable fibroids. Hg stable, no vaginal bleeding. I think likely viral gastro. Will dc home with zofran prn but patient had no vomiting in the ED. Imodium prn for diarrhea.   Final Clinical Impressions(s) / ED Diagnoses   Final diagnoses:  None      ED Discharge Orders    None       Drenda Freeze, MD 07/15/17 1050

## 2017-07-15 NOTE — ED Triage Notes (Signed)
Abdominal pain that started this morning. Patient also reports nausea and diarrhea. Pain 10/10. Pain predominately in LLQ and RLQ.

## 2018-12-12 IMAGING — CT CT ABD-PELV W/ CM
2 of 5 series · 16 of 46 positions shown, 18 images · IV contrast (ISOVUE)
Comparison: No priors.

CLINICAL DATA: 50-year-old female with history of pain in the left
lower quadrant for the past 2 weeks progressively worsening, now
with generalized abdominal pain for the past 6 days. Nausea and
sweating at home.

EXAM:
CT ABDOMEN AND PELVIS WITH CONTRAST
TECHNIQUE: Multidetector CT imaging of the abdomen and pelvis was performed
using the standard protocol following bolus administration of
intravenous contrast.
CONTRAST:  <See Chart> LDPDQC-V77 IOPAMIDOL (LDPDQC-V77) INJECTION
61%

[Series 2: abd/pel with · axial · 0.79mm/px · z∈[+676,+1051]mm · 13 of 89 slices shown, 15 images]
[im 7/89  soft-tissue]
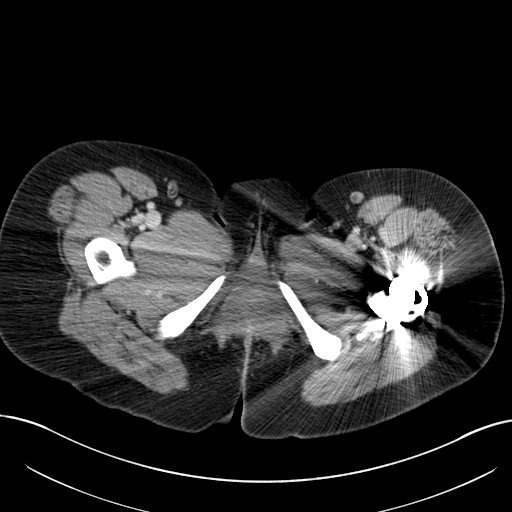
[im 7/89  bone]
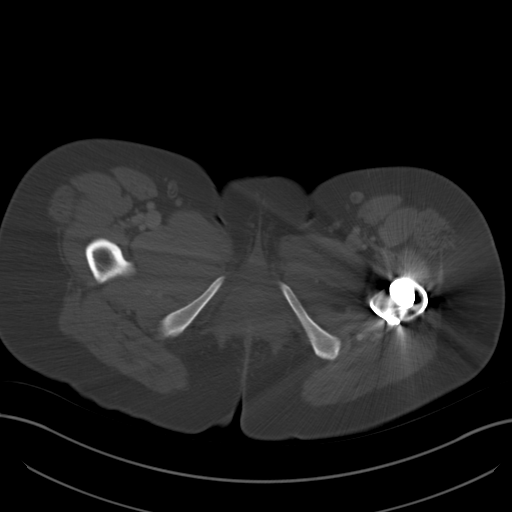
[im 13/89  soft-tissue]
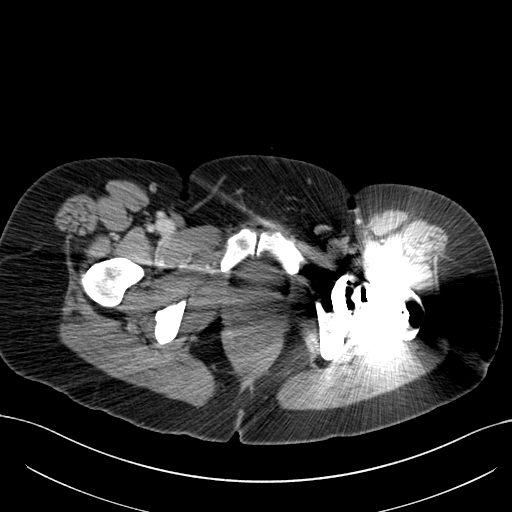
[im 19/89  soft-tissue]
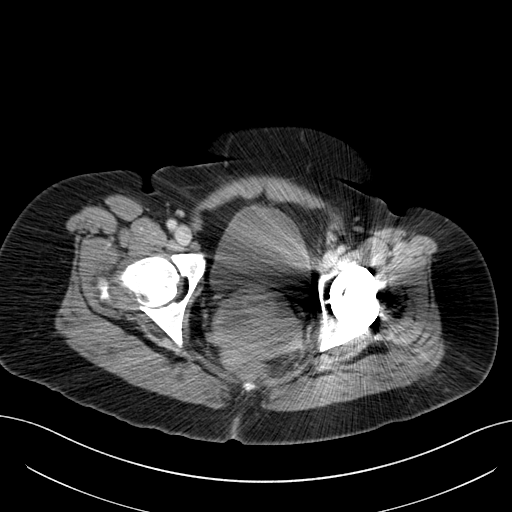
[im 26/89  soft-tissue]
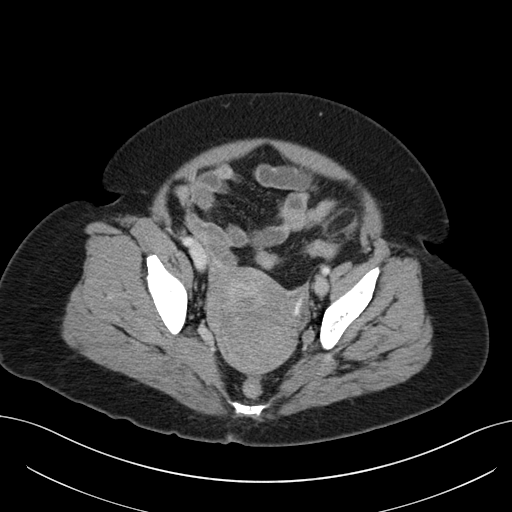
[im 32/89  soft-tissue]
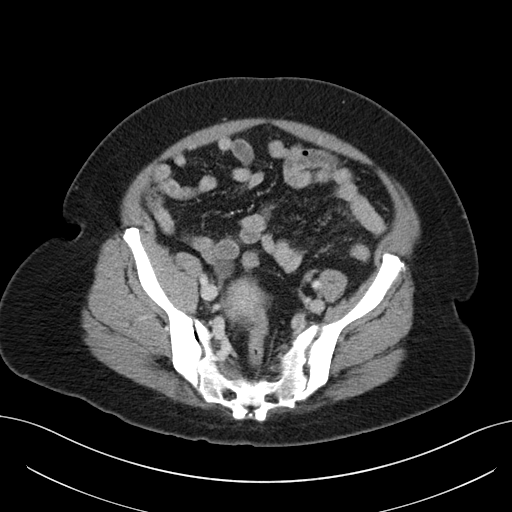
[im 38/89  soft-tissue]
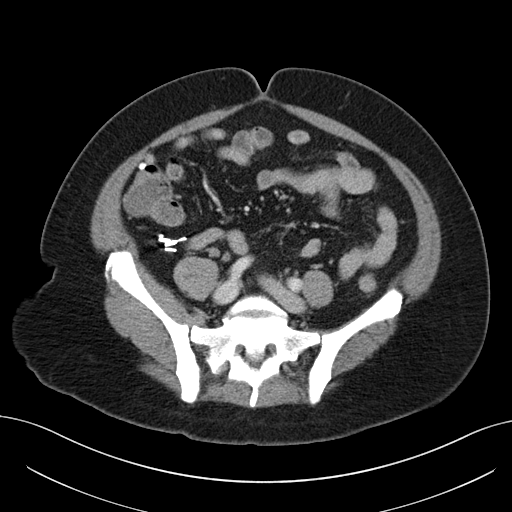
[im 45/89  soft-tissue]
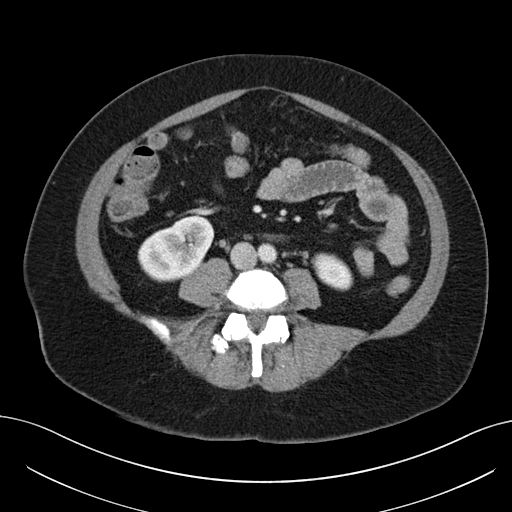
[im 51/89  soft-tissue]
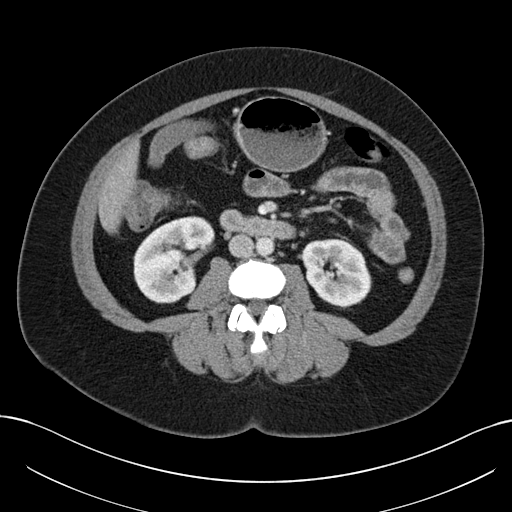
[im 57/89  soft-tissue]
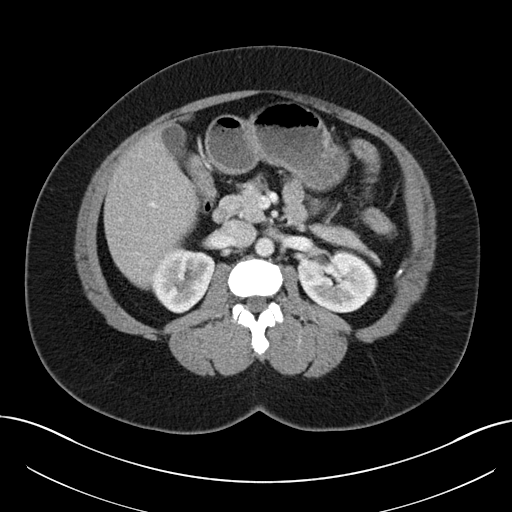
[im 57/89  bone]
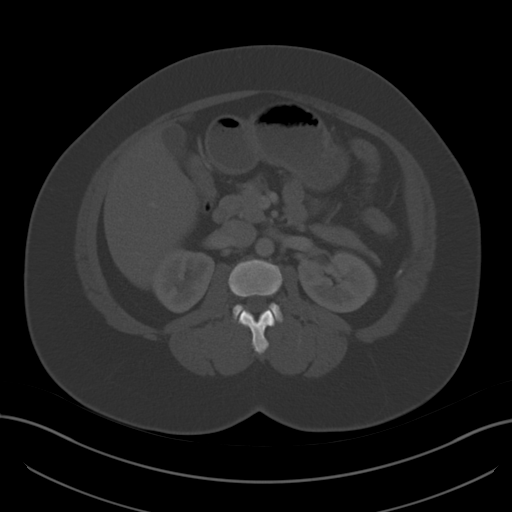
[im 63/89  soft-tissue]
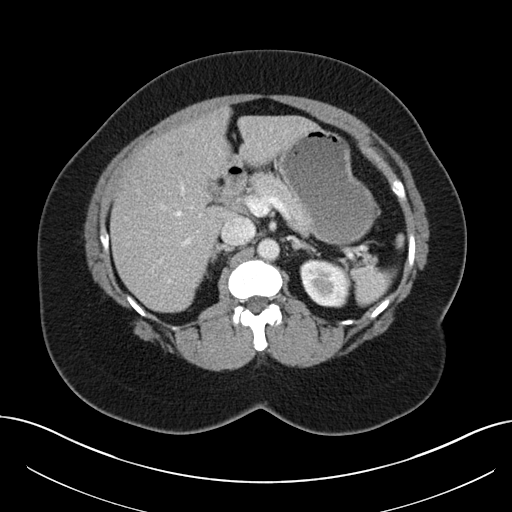
[im 70/89  soft-tissue]
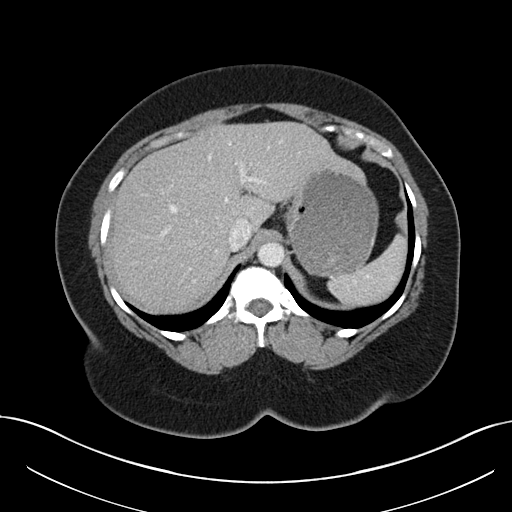
[im 76/89  soft-tissue]
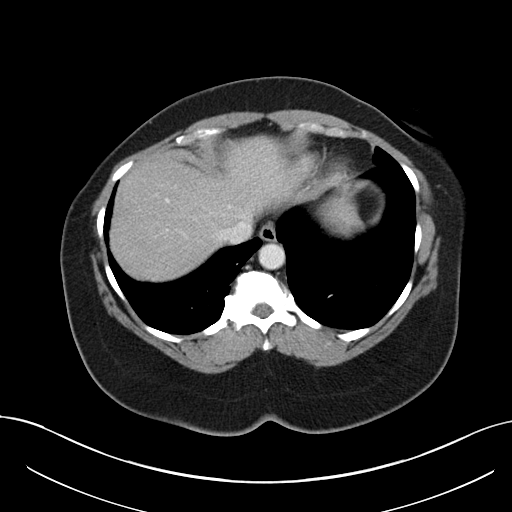
[im 82/89  soft-tissue]
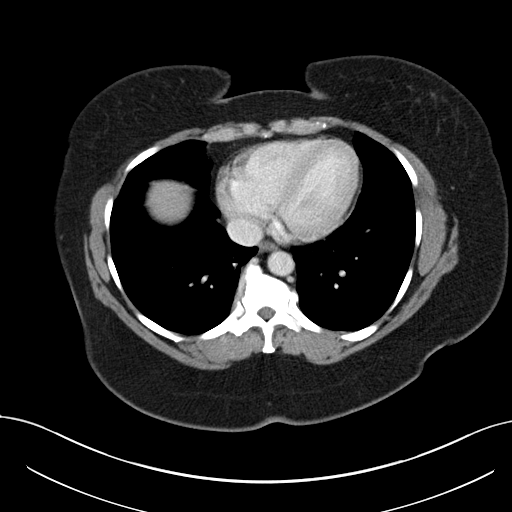

[Series 4: coronal a/|p · coronal · 0.74mm/px · 3 of 185 slices shown]
[im 62/185  soft-tissue]
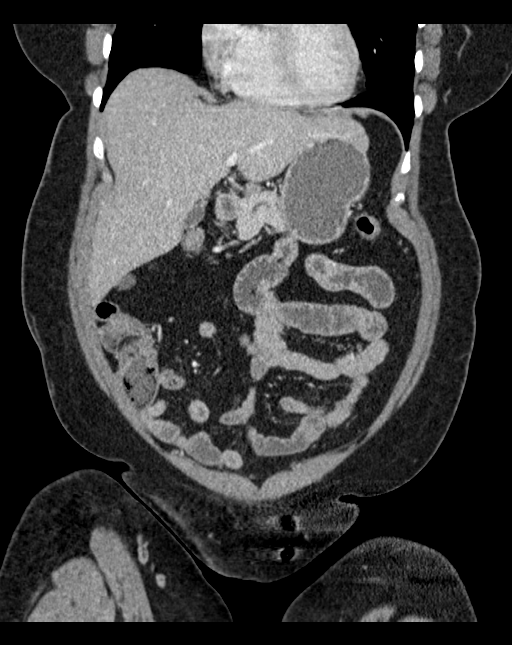
[im 82/185  soft-tissue]
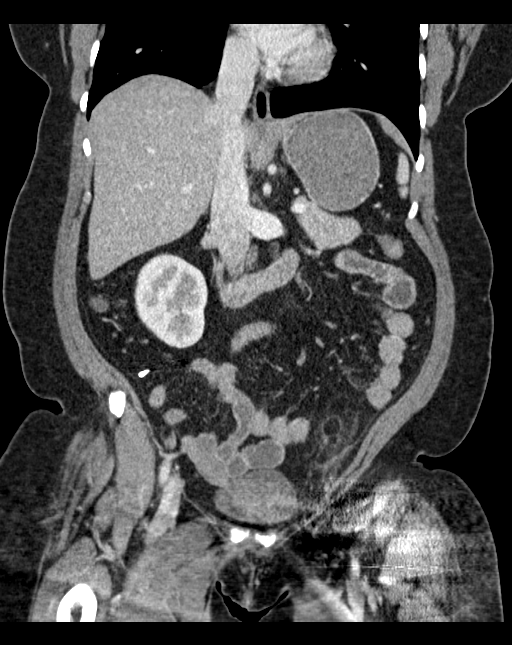
[im 103/185  soft-tissue]
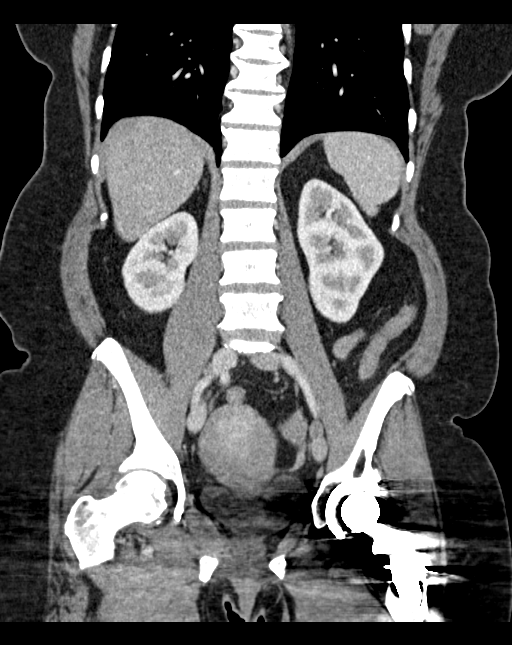

[16 of 46 positions shown; findings below may reference images not displayed]

FINDINGS: Lower chest: Unremarkable.

Hepatobiliary: No cystic or solid hepatic lesions. No intra or
extrahepatic biliary ductal dilatation. Gallbladder is normal in
appearance.

Pancreas: No pancreatic mass. No pancreatic ductal dilatation. No
pancreatic or peripancreatic fluid or inflammatory changes.

Spleen: Unremarkable.

Adrenals/Urinary Tract: Bilateral kidneys and bilateral adrenal
glands are normal in appearance. No hydroureteronephrosis. Urinary
bladder is normal in appearance.

Stomach/Bowel: Normal appearance of the stomach. No pathologic
dilatation of small bowel or colon. Adjacent to the proximal sigmoid
colon there are some focal fatty attenuation areas surrounded by
inflammatory changes, favored to represent epiploic appendagitis.
Status post appendectomy.

Vascular/Lymphatic: No significant atherosclerotic disease, aneurysm
or dissection noted in the abdominal or pelvic vasculature. No
lymphadenopathy noted in the abdomen or pelvis.

Reproductive: 5.1 cm soft tissue mass extending from the dorsal
aspect of the body of the uterus is most compatible with a large
fibroid. Ovaries are unremarkable in appearance.

Other: No significant volume of ascites.  No pneumoperitoneum.

Musculoskeletal: There are no aggressive appearing lytic or blastic
lesions noted in the visualized portions of the skeleton. Status
post left hip arthroplasty.
IMPRESSION: 1. Epiploic appendagitis involving the proximal sigmoid mesocolon.
2. Additional incidental findings, as above.

## 2019-02-08 ENCOUNTER — Encounter (HOSPITAL_COMMUNITY): Payer: Self-pay | Admitting: Emergency Medicine

## 2019-02-08 ENCOUNTER — Other Ambulatory Visit: Payer: Self-pay

## 2019-02-08 ENCOUNTER — Ambulatory Visit (HOSPITAL_COMMUNITY)
Admission: EM | Admit: 2019-02-08 | Discharge: 2019-02-08 | Disposition: A | Discharge status: Home / Self Care | Payer: BLUE CROSS/BLUE SHIELD | Attending: Family Medicine | Admitting: Family Medicine

## 2019-02-08 DIAGNOSIS — M7989 Other specified soft tissue disorders: Secondary | ICD-10-CM | POA: Diagnosis not present

## 2019-02-08 NOTE — Discharge Instructions (Addendum)
Swelling can be caused by hormones, very hot weather, eating too much salt. Your examination is normal.  Heart nice and regular, sounds strong.  Lungs are clear I do not find any medical problems to worry about Follow-up with your primary care doctor

## 2019-02-08 NOTE — ED Provider Notes (Signed)
[place]    CSN: [id number] Arrival date & time: 02/08/19  1955     History   Chief Complaint Chief Complaint  Patient presents with  . Arm Swelling    HPI Karen Reese is a 53 y.o. female.   HPI  Patient is here with vague symptoms.  She states that she is here because her daughters wanted to be seen.  She complains of swelling in her forearms.  She shows me her arms but I am unable to appreciate her concern.  She states she does have some distention in her abdomen at times, signs feeling of swelling in her ankles. She has had some swelling before. No shortness of breath, chest pain, heart disease. No change in medications. She does not limit salt in her diet. She also states that she has a discoloration on her chest.  It is very faint.  She thinks it is a bruise.  Does not remember trauma. She is perimenopausal  Past Medical History:  Diagnosis Date  . Anxiety   . Asthma   . Prediabetes     There are no active problems to display for this patient.   Past Surgical History:  Procedure Laterality Date  . APPENDECTOMY    . DILATION AND CURETTAGE OF UTERUS    . TOTAL HIP ARTHROPLASTY    . TUBAL LIGATION      OB History   No obstetric history on file.      Home Medications    Prior to Admission medications   Medication Sig Start Date End Date Taking? Authorizing Provider  Citalopram Hydrobromide (CELEXA PO) Take by mouth.   Yes [provider]  famotidine (PEPCID) 20 MG tablet Take 1 tablet (20 mg total) by mouth 2 (two) times daily. 05/10/17  Yes Isla Pence, MD  LORazepam (ATIVAN) 0.5 MG tablet Take 0.5 mg 2 (two) times daily as needed by mouth for anxiety.   Yes [provider]  albuterol (PROVENTIL HFA;VENTOLIN HFA) 108 (90 Base) MCG/ACT inhaler Inhale 1-2 puffs every 6 (six) hours as needed into the lungs for wheezing or shortness of breath.    [provider]  naproxen sodium (ALEVE) 220 MG tablet Take 660 mg  daily as needed by mouth (for pain).    [provider]    Family History Family History  Problem Relation Age of Onset  . Hypertension Other     Social History Social History   Tobacco Use  . Smoking status: Never Smoker  . Smokeless tobacco: Never Used  Substance Use Topics  . Alcohol use: Yes    Comment: social  . Drug use: No     Allergies   Bee venom; Fire Manufacturing systems engineer; and Monosodium glutamate   Review of Systems Review of Systems  Constitutional: Negative for chills and fever.  HENT: Negative for ear pain and sore throat.   Eyes: Negative for pain and visual disturbance.  Respiratory: Negative for cough and shortness of breath.   Cardiovascular: Positive for leg swelling. Negative for chest pain and palpitations.       States that she feels like her arms and legs are swelling  Gastrointestinal: Negative for abdominal pain and vomiting.  Genitourinary: Negative for dysuria and hematuria.  Musculoskeletal: Negative for arthralgias and back pain.  Skin: Positive for color change. Negative for rash.  Neurological: Negative for seizures and syncope.  All other systems reviewed and are negative.    Physical Exam Triage Vital Signs ED Triage Vitals  Enc Vitals Group     BP 02/08/19 2023 (!) 165/98     Pulse Rate 02/08/19 2023 81     Resp 02/08/19 2023 18     Temp 02/08/19 2023 98.1 F (36.7 C)     Temp Source 02/08/19 2023 Temporal     SpO2 02/08/19 2023 100 %     Weight --      Height --      Head Circumference --      Peak Flow --      Pain Score 02/08/19 2050 4     Pain Loc --      Pain Edu? --      Excl. in [place]? --    No data found.  Updated Vital Signs BP 119/84 (BP Location: Right Arm) Comment: repositioning  Pulse 87   Temp 98.8 F (37.1 C) (Oral)   Resp 18   SpO2 100%      Physical Exam Constitutional:      General: She is not in acute distress.    Appearance: She is well-developed.     Comments: Overweight  HENT:     Head:  Normocephalic and atraumatic.     Right Ear: Tympanic membrane, ear canal and external ear normal.     Left Ear: Tympanic membrane, ear canal and external ear normal.     Nose: Nose normal. No congestion.     Mouth/Throat:     Mouth: Mucous membranes are moist.     Pharynx: No posterior oropharyngeal erythema.  Eyes:     Conjunctiva/sclera: Conjunctivae normal.     Pupils: Pupils are equal, round, and reactive to light.  Neck:     Musculoskeletal: Normal range of motion.     Comments: No thyromegaly, no bruit Cardiovascular:     Rate and Rhythm: Normal rate and regular rhythm.     Heart sounds: Normal heart sounds.  Pulmonary:     Effort: Pulmonary effort is normal. No respiratory distress.     Breath sounds: Normal breath sounds.  Abdominal:     General: Abdomen is flat. There is no distension.     Palpations: Abdomen is soft. There is no mass.     Comments: No organomegaly  Musculoskeletal: Normal range of motion.     Right lower leg: Edema present.     Left lower leg: Edema present.     Comments: Trace to 1+ edema bilaterally.  Upper extremities look normal.  No swelling of hands  Lymphadenopathy:     Cervical: No cervical adenopathy.  Skin:    General: Skin is warm and dry.  Neurological:     General: No focal deficit present.     Mental Status: She is alert.  Psychiatric:        Mood and Affect: Mood normal.        Behavior: Behavior normal.      UC Treatments / Results  Labs (all labs ordered are listed, but only abnormal results are displayed) Labs Reviewed - No data to display  EKG None  Radiology No results found.  Procedures Procedures (including critical care time)  Medications Ordered in UC Medications - No data to display  Initial Impression / Assessment and Plan / UC Course  I have reviewed the triage vital signs and the nursing notes.  Pertinent labs & imaging results that were available during my care of the patient were reviewed by me and  considered in my medical decision making (see chart for details).  Patient is quite satisfied with a normal physical examination.  We discussed a number of things can cause mild edema.  I do not see anything to worry about on exam. Final Clinical Impressions(s) / UC Diagnoses   Final diagnoses:  Swelling of limb     Discharge Instructions     Swelling can be caused by hormones, very hot weather, eating too much salt. Your examination is normal.  Heart nice and regular, sounds strong.  Lungs are clear I do not find any medical problems to worry about Follow-up with your primary care doctor    ED Prescriptions    None     Controlled Substance Prescriptions [place] Controlled Substance Registry consulted? Not Applicable   Raylene Everts, MD 02/08/19 2107

## 2019-02-08 NOTE — ED Triage Notes (Signed)
Seen by dr Meda Coffee prior to clinical staff

## 2019-09-03 DIAGNOSIS — Z87442 Personal history of urinary calculi: Secondary | ICD-10-CM

## 2019-09-03 HISTORY — DX: Personal history of urinary calculi: Z87.442

## 2020-01-28 ENCOUNTER — Ambulatory Visit (HOSPITAL_COMMUNITY)
Admission: EM | Admit: 2020-01-28 | Discharge: 2020-01-28 | Disposition: A | Discharge status: Home / Self Care | Payer: 59 | Attending: Family Medicine | Admitting: Family Medicine

## 2020-01-28 ENCOUNTER — Encounter (HOSPITAL_COMMUNITY): Payer: Self-pay

## 2020-01-28 ENCOUNTER — Other Ambulatory Visit: Payer: Self-pay

## 2020-01-28 DIAGNOSIS — S93402A Sprain of unspecified ligament of left ankle, initial encounter: Secondary | ICD-10-CM | POA: Diagnosis not present

## 2020-01-28 DIAGNOSIS — M25472 Effusion, left ankle: Secondary | ICD-10-CM | POA: Diagnosis not present

## 2020-01-28 DIAGNOSIS — J4520 Mild intermittent asthma, uncomplicated: Secondary | ICD-10-CM

## 2020-01-28 DIAGNOSIS — M25572 Pain in left ankle and joints of left foot: Secondary | ICD-10-CM

## 2020-01-28 MED ORDER — ALBUTEROL SULFATE HFA 108 (90 BASE) MCG/ACT IN AERS: 1.0000 | INHALATION_SPRAY | Freq: Four times a day (QID) | RESPIRATORY_TRACT | 0 refills | Status: AC | PRN

## 2020-01-28 MED ORDER — CYCLOBENZAPRINE HCL 10 MG PO TABS: 10.0000 mg | ORAL_TABLET | Freq: Two times a day (BID) | ORAL | 0 refills | Status: DC | PRN

## 2020-01-28 MED ORDER — IBUPROFEN 800 MG PO TABS: 800.0000 mg | ORAL_TABLET | Freq: Three times a day (TID) | ORAL | 0 refills | Status: DC | PRN

## 2020-01-28 NOTE — ED Triage Notes (Signed)
Pt reports she twisted her left foot 2 days ago in her house when she was trying to grab her laptop from her dresser.   Pt needs Ventolin inhaler refill.

## 2020-01-28 NOTE — Discharge Instructions (Addendum)
Take the ibuprofen as prescribed.  Rest and elevate your foot.  Apply ice packs 2-3 times a day for up to 20 minutes each.  Wear the ASO brace when up and active as well as for comfort.   Follow up with your primary care provider or an orthopedist if you symptoms continue or worsen;  Or if you develop new symptoms, such as numbness, tingling, or weakness.    I also refilled your inhaler today

## 2020-01-31 NOTE — ED Provider Notes (Signed)
[place]   [id number] 01/28/20 Arrival Time: [id number]  [id number] PAIN  SUBJECTIVE: History from: patient. Karen Reese is a 54 y.o. female complains of left ankle pain that began 2 days ago. Reports that she twisted her ankle as she was turning while walking at home. Describes the pain as intermittent and achy in character. Has tried OTC medications without relief. Symptoms are made worse with activity. Denies similar symptoms in the past. Denies fever, chills, erythema, ecchymosis, effusion, weakness, numbness and tingling, saddle paresthesias, loss of bowel or bladder function. Requesting refill of ventolin inhaler.     ROS: As per HPI.  All other pertinent ROS negative.     Past Medical History:  Diagnosis Date  . Anxiety   . Asthma   . Prediabetes    Past Surgical History:  Procedure Laterality Date  . APPENDECTOMY    . DILATION AND CURETTAGE OF UTERUS    . TOTAL HIP ARTHROPLASTY    . TUBAL LIGATION     Allergies  Allergen Reactions  . Bee Venom Anaphylaxis, Hives and Swelling  . Fire Ant Anaphylaxis, Hives and Swelling  . Monosodium Glutamate Shortness Of Breath and Diarrhea    *MSG*   No current facility-administered medications on file prior to encounter.   Current Outpatient Medications on File Prior to Encounter  Medication Sig Dispense Refill  . Citalopram Hydrobromide (CELEXA PO) Take by mouth.    . famotidine (PEPCID) 20 MG tablet Take 1 tablet (20 mg total) by mouth 2 (two) times daily. 30 tablet 0  . LORazepam (ATIVAN) 0.5 MG tablet Take 0.5 mg 2 (two) times daily as needed by mouth for anxiety.    . naproxen sodium (ALEVE) 220 MG tablet Take 660 mg daily as needed by mouth (for pain).     Social History   Socioeconomic History  . Marital status: Divorced    Spouse name: Not on file  . Number of children: Not on file  . Years of education: Not on file  . Highest education level: Not on file  Occupational History  . Not on file  Tobacco  Use  . Smoking status: Never Smoker  . Smokeless tobacco: Never Used  Substance and Sexual Activity  . Alcohol use: Yes    Comment: social  . Drug use: No  . Sexual activity: Not on file  Other Topics Concern  . Not on file  Social History Narrative  . Not on file   Social Determinants of Health   Financial Resource Strain:   . Difficulty of Paying Living Expenses:   Food Insecurity:   . Worried About Charity fundraiser in the Last Year:   . Arboriculturist in the Last Year:   Transportation Needs:   . Film/video editor (Medical):   Marland Kitchen Lack of Transportation (Non-Medical):   Physical Activity:   . Days of Exercise per Week:   . Minutes of Exercise per Session:   Stress:   . Feeling of Stress :   Social Connections:   . Frequency of Communication with Friends and Family:   . Frequency of Social Gatherings with Friends and Family:   . Attends Religious Services:   . Active Member of Clubs or Organizations:   . Attends Archivist Meetings:   Marland Kitchen Marital Status:   Intimate Partner Violence:   . Fear of Current or Ex-Partner:   . Emotionally Abused:   Marland Kitchen Physically Abused:   . Sexually Abused:  Family History  Problem Relation Age of Onset  . Hypertension Other     OBJECTIVE:  Vitals:   01/28/20 1835  BP: 125/83  Pulse: 88  Resp: 18  Temp: (!) 97.5 F (36.4 C)  TempSrc: Oral  SpO2: 100%    General appearance: ALERT; in no acute distress.  Head: NCAT Lungs: Normal respiratory effort CV: pedal pulses 2+ bilaterally. Cap refill < 2 seconds Musculoskeletal:  Inspection: Skin warm, dry, clear and intact without obvious erythema, effusion, or ecchymosis.  Palpation: medial aspect of left ankle swollen and tender to palpation ROM: FROM active and passive Skin: warm and dry Neurologic: Ambulates without difficulty; Sensation intact about the upper/ lower extremities Psychological: alert and cooperative; normal mood and affect  DIAGNOSTIC  STUDIES:  No results found.   ASSESSMENT & PLAN:  1. Pain and swelling of left ankle   2. Sprain of left ankle, unspecified ligament, initial encounter   3. Mild intermittent asthma without complication     Meds ordered this encounter  Medications  . albuterol (VENTOLIN HFA) 108 (90 Base) MCG/ACT inhaler    Sig: Inhale 1-2 puffs into the lungs every 6 (six) hours as needed for wheezing or shortness of breath.    Dispense:  18 g    Refill:  0    Order Specific Question:   Supervising Provider    Answer:   Chase Picket [id number]  . ibuprofen (ADVIL) 800 MG tablet    Sig: Take 1 tablet (800 mg total) by mouth every 8 (eight) hours as needed for moderate pain.    Dispense:  21 tablet    Refill:  0    Order Specific Question:   Supervising Provider    Answer:   Chase Picket [id number]  . cyclobenzaprine (FLEXERIL) 10 MG tablet    Sig: Take 1 tablet (10 mg total) by mouth 2 (two) times daily as needed for muscle spasms.    Dispense:  20 tablet    Refill:  0    Order Specific Question:   Supervising Provider    Answer:   Chase Picket [id number]   L ankle sprain Prescribed ibuprofen Prescribed flexeril Continue conservative management of rest, ice, and gentle stretches Take ibuprofen as needed for pain relief (may cause abdominal discomfort, ulcers, and GI bleeds avoid taking with other NSAIDs) Take cyclobenzaprine at nighttime for symptomatic relief. Avoid driving or operating heavy machinery while using medication. Follow up with PCP if symptoms persist Return or go to the ER if you have any new or worsening symptoms (fever, chills, chest pain, abdominal pain, changes in bowel or bladder habits, pain radiating into lower legs)   Reviewed expectations re: course of current medical issues. Questions answered. Outlined signs and symptoms indicating need for more acute intervention. Patient verbalized understanding. After Visit Summary given.       Faustino Congress, NP 01/31/20 1759

## 2020-04-24 ENCOUNTER — Ambulatory Visit (HOSPITAL_COMMUNITY)
Admission: EM | Admit: 2020-04-24 | Discharge: 2020-04-24 | Disposition: A | Discharge status: Home / Self Care | Payer: 59 | Attending: Family Medicine | Admitting: Family Medicine

## 2020-04-24 ENCOUNTER — Other Ambulatory Visit: Payer: Self-pay

## 2020-04-24 ENCOUNTER — Encounter (HOSPITAL_COMMUNITY): Payer: Self-pay

## 2020-04-24 DIAGNOSIS — N200 Calculus of kidney: Secondary | ICD-10-CM | POA: Insufficient documentation

## 2020-04-24 LAB — COMPREHENSIVE METABOLIC PANEL
ALT: 24 U/L (ref 0–44)
AST: 23 U/L (ref 15–41)
Albumin: 3.5 g/dL (ref 3.5–5.0)
Alkaline Phosphatase: 51 U/L (ref 38–126)
Anion gap: 10 (ref 5–15)
BUN: 14 mg/dL (ref 6–20)
CO2: 30 mmol/L (ref 22–32)
Calcium: 10.1 mg/dL (ref 8.9–10.3)
Chloride: 102 mmol/L (ref 98–111)
Creatinine, Ser: 0.86 mg/dL (ref 0.44–1.00)
GFR calc Af Amer: >60 mL/min (ref >60)
GFR calc non Af Amer: >60 mL/min (ref >60)
Glucose, Bld: 91 mg/dL (ref 70–99)
Potassium: 3 mmol/L — ABNORMAL LOW (ref 3.5–5.1)
Sodium: 142 mmol/L (ref 135–145)
Total Bilirubin: 0.6 mg/dL (ref 0.3–1.2)
Total Protein: 7.4 g/dL (ref 6.5–8.1)

## 2020-04-24 LAB — POCT URINALYSIS DIPSTICK, ED / UC
Bilirubin Urine: NEGATIVE
Glucose, UA: NEGATIVE mg/dL
Leukocytes,Ua: NEGATIVE
Nitrite: NEGATIVE
Protein, ur: NEGATIVE mg/dL
Specific Gravity, Urine: >=1.03 (ref 1.005–1.030)
Urobilinogen, UA: 0.2 mg/dL (ref 0.0–1.0)
pH: 5.5 (ref 5.0–8.0)

## 2020-04-24 MED ORDER — KETOROLAC TROMETHAMINE 30 MG/ML IJ SOLN
INTRAMUSCULAR | Status: AC
  Filled 2020-04-24: qty 1

## 2020-04-24 MED ORDER — KETOROLAC TROMETHAMINE 30 MG/ML IJ SOLN
30.0000 mg | Freq: Once | INTRAMUSCULAR | Status: AC
  Administered 2020-04-24: 19:00:00 30 mg via INTRAMUSCULAR

## 2020-04-24 MED ORDER — TAMSULOSIN HCL 0.4 MG PO CAPS: 0.4000 mg | ORAL_CAPSULE | Freq: Every day | ORAL | 0 refills | Status: DC

## 2020-04-24 MED ORDER — IBUPROFEN 600 MG PO TABS: 600.0000 mg | ORAL_TABLET | Freq: Three times a day (TID) | ORAL | 0 refills | Status: DC | PRN

## 2020-04-24 NOTE — ED Triage Notes (Signed)
Pt presents with bilateral flank with urinary symptoms X 2 weeks.

## 2020-04-24 NOTE — ED Provider Notes (Signed)
[place]    CSN: [id number] Arrival date & time: 04/24/20  1618      History   Chief Complaint Chief Complaint  Patient presents with  . Flank Pain    HPI Karen Reese is a 54 y.o. female.   She is presenting with flank pain on the right side.  Symptoms been ongoing for the past 2 weeks.  She has a history of kidney stones from several years ago.  These resolved spontaneously.  Denies any history of urinary infection or abdominal surgery.  Symptoms are intermittent in nature.  HPI  Past Medical History:  Diagnosis Date  . Anxiety   . Asthma   . Prediabetes     There are no problems to display for this patient.   Past Surgical History:  Procedure Laterality Date  . APPENDECTOMY    . DILATION AND CURETTAGE OF UTERUS    . TOTAL HIP ARTHROPLASTY    . TUBAL LIGATION      OB History   No obstetric history on file.      Home Medications    Prior to Admission medications   Medication Sig Start Date End Date Taking? Authorizing Provider  albuterol (VENTOLIN HFA) 108 (90 Base) MCG/ACT inhaler Inhale 1-2 puffs into the lungs every 6 (six) hours as needed for wheezing or shortness of breath. 01/28/20   Faustino Congress, NP  Citalopram Hydrobromide (CELEXA PO) Take by mouth.    [provider]  cyclobenzaprine (FLEXERIL) 10 MG tablet Take 1 tablet (10 mg total) by mouth 2 (two) times daily as needed for muscle spasms. 01/28/20   Faustino Congress, NP  famotidine (PEPCID) 20 MG tablet Take 1 tablet (20 mg total) by mouth 2 (two) times daily. 05/10/17   Isla Pence, MD  ibuprofen (ADVIL) 600 MG tablet Take 1 tablet (600 mg total) by mouth every 8 (eight) hours as needed. 04/24/20   Rosemarie Ax, MD  LORazepam (ATIVAN) 0.5 MG tablet Take 0.5 mg 2 (two) times daily as needed by mouth for anxiety.    [provider]  tamsulosin (FLOMAX) 0.4 MG CAPS capsule Take 1 capsule (0.4 mg total) by mouth daily after breakfast. 04/24/20    Rosemarie Ax, MD    Family History Family History  Problem Relation Age of Onset  . Hypertension Other     Social History Social History   Tobacco Use  . Smoking status: Never Smoker  . Smokeless tobacco: Never Used  Substance Use Topics  . Alcohol use: Yes    Comment: social  . Drug use: No     Allergies   Bee venom, Fire ant, and Monosodium glutamate   Review of Systems Review of Systems  See HPI  Physical Exam Triage Vital Signs ED Triage Vitals  Enc Vitals Group     BP 04/24/20 1806 127/85     Pulse Rate 04/24/20 1806 86     Resp 04/24/20 1806 20     Temp 04/24/20 1806 98.2 F (36.8 C)     Temp Source 04/24/20 1806 Oral     SpO2 04/24/20 1806 100 %     Weight --      Height --      Head Circumference --      Peak Flow --      Pain Score 04/24/20 1804 8     Pain Loc --      Pain Edu? --      Excl. in [place]? --  No data found.  Updated Vital Signs BP 127/85 (BP Location: Right Arm)   Pulse 86   Temp 98.2 F (36.8 C) (Oral)   Resp 20   SpO2 100%   Visual Acuity Right Eye Distance:   Left Eye Distance:   Bilateral Distance:    Right Eye Near:   Left Eye Near:    Bilateral Near:     Physical Exam Gen: NAD, alert, cooperative with exam, well-appearing ENT: normal lips, normal nasal mucosa,  Eye: normal EOM, normal conjunctiva and lids Resp: no accessory muscle use, non-labored,  Skin: no rashes, no areas of induration  Neuro: normal tone, normal sensation to touch Psych:  normal insight, alert and oriented MSK:  CVA tenderness on the right. Neurovascular intact.    UC Treatments / Results  Labs (all labs ordered are listed, but only abnormal results are displayed) Labs Reviewed  POCT URINALYSIS DIPSTICK, ED / UC - Abnormal; Notable for the following components:      Result Value   Ketones, ur TRACE (*)    Hgb urine dipstick MODERATE (*)    All other components within normal limits  COMPREHENSIVE METABOLIC PANEL     EKG   Radiology No results found.  Procedures Procedures (including critical care time)  Medications Ordered in UC Medications  ketorolac (TORADOL) 30 MG/ML injection 30 mg (30 mg Intramuscular Given 04/24/20 1854)    Initial Impression / Assessment and Plan / UC Course  I have reviewed the triage vital signs and the nursing notes.  Pertinent labs & imaging results that were available during my care of the patient were reviewed by me and considered in my medical decision making (see chart for details).     Ms. Dunigan is a 54 year old female that is presenting with symptoms suggestive of nephrolithiasis.  Hemoglobin was noticed in the urine.  Provided Toradol.  Sent in Flomax and ibuprofen.  Provided a strainer.  Counseled supportive care.  Give indications on following up seeking more immediate care.  Final Clinical Impressions(s) / UC Diagnoses   Final diagnoses:  Nephrolithiasis     Discharge Instructions     Please try to strain the voids Please follow up if your symptoms are ongoing  Please be seen in the emergency department if your symptoms worsen.     ED Prescriptions    Medication Sig Dispense Auth. Provider   ibuprofen (ADVIL) 600 MG tablet Take 1 tablet (600 mg total) by mouth every 8 (eight) hours as needed. 45 tablet Rosemarie Ax, MD   tamsulosin (FLOMAX) 0.4 MG CAPS capsule Take 1 capsule (0.4 mg total) by mouth daily after breakfast. 30 capsule Rosemarie Ax, MD     PDMP not reviewed this encounter.   Rosemarie Ax, MD 04/24/20 [phone]

## 2020-04-24 NOTE — Discharge Instructions (Signed)
Please try to strain the voids Please follow up if your symptoms are ongoing  Please be seen in the emergency department if your symptoms worsen.

## 2020-04-24 NOTE — ED Triage Notes (Signed)
Pt also complains of recurrent dizziness for past few weeks from unknown source.

## 2020-08-12 ENCOUNTER — Emergency Department (HOSPITAL_COMMUNITY)
Admission: EM | Admit: 2020-08-12 | Discharge: 2020-08-12 | Disposition: A | Discharge status: Home / Self Care | Payer: 59 | Attending: Emergency Medicine | Admitting: Emergency Medicine

## 2020-08-12 ENCOUNTER — Encounter (HOSPITAL_COMMUNITY): Payer: Self-pay | Admitting: Emergency Medicine

## 2020-08-12 ENCOUNTER — Ambulatory Visit (HOSPITAL_COMMUNITY)
Admission: EM | Admit: 2020-08-12 | Discharge: 2020-08-12 | Disposition: A | Discharge status: Home / Self Care | Payer: 59 | Attending: Family Medicine | Admitting: Family Medicine

## 2020-08-12 ENCOUNTER — Other Ambulatory Visit: Payer: Self-pay

## 2020-08-12 ENCOUNTER — Emergency Department (HOSPITAL_COMMUNITY): Payer: 59

## 2020-08-12 DIAGNOSIS — J45909 Unspecified asthma, uncomplicated: Secondary | ICD-10-CM | POA: Diagnosis not present

## 2020-08-12 DIAGNOSIS — R059 Cough, unspecified: Secondary | ICD-10-CM | POA: Diagnosis not present

## 2020-08-12 DIAGNOSIS — M25512 Pain in left shoulder: Secondary | ICD-10-CM | POA: Insufficient documentation

## 2020-08-12 DIAGNOSIS — R5383 Other fatigue: Secondary | ICD-10-CM

## 2020-08-12 DIAGNOSIS — R079 Chest pain, unspecified: Secondary | ICD-10-CM

## 2020-08-12 DIAGNOSIS — R000 Tachycardia, unspecified: Secondary | ICD-10-CM | POA: Diagnosis not present

## 2020-08-12 DIAGNOSIS — R350 Frequency of micturition: Secondary | ICD-10-CM | POA: Diagnosis not present

## 2020-08-12 DIAGNOSIS — R200 Anesthesia of skin: Secondary | ICD-10-CM

## 2020-08-12 DIAGNOSIS — R0602 Shortness of breath: Secondary | ICD-10-CM | POA: Insufficient documentation

## 2020-08-12 DIAGNOSIS — Z96649 Presence of unspecified artificial hip joint: Secondary | ICD-10-CM | POA: Diagnosis not present

## 2020-08-12 LAB — CBC
HCT: 38.1 % (ref 36.0–46.0)
Hemoglobin: 12.2 g/dL (ref 12.0–15.0)
MCH: 29.8 pg (ref 26.0–34.0)
MCHC: 32 g/dL (ref 30.0–36.0)
MCV: 92.9 fL (ref 80.0–100.0)
Platelets: 306 10*3/uL (ref 150–400)
RBC: 4.1 MIL/uL (ref 3.87–5.11)
RDW: 15.2 % (ref 11.5–15.5)
WBC: 13.9 10*3/uL — ABNORMAL HIGH (ref 4.0–10.5)
nRBC: 0 % (ref 0.0–0.2)

## 2020-08-12 LAB — BASIC METABOLIC PANEL
Anion gap: 10 (ref 5–15)
BUN: 13 mg/dL (ref 6–20)
CO2: 25 mmol/L (ref 22–32)
Calcium: 9.6 mg/dL (ref 8.9–10.3)
Chloride: 107 mmol/L (ref 98–111)
Creatinine, Ser: 0.82 mg/dL (ref 0.44–1.00)
GFR, Estimated: >60 mL/min (ref >60)
Glucose, Bld: 113 mg/dL — ABNORMAL HIGH (ref 70–99)
Potassium: 3 mmol/L — ABNORMAL LOW (ref 3.5–5.1)
Sodium: 142 mmol/L (ref 135–145)

## 2020-08-12 LAB — TROPONIN I (HIGH SENSITIVITY)
Troponin I (High Sensitivity): 6 ng/L (ref <18)
Troponin I (High Sensitivity): 6 ng/L (ref <18)

## 2020-08-12 LAB — URINALYSIS, ROUTINE W REFLEX MICROSCOPIC
Bacteria, UA: NONE SEEN
Bilirubin Urine: NEGATIVE
Glucose, UA: NEGATIVE mg/dL
Ketones, ur: NEGATIVE mg/dL
Leukocytes,Ua: NEGATIVE
Nitrite: NEGATIVE
Protein, ur: NEGATIVE mg/dL
Specific Gravity, Urine: 1.017 (ref 1.005–1.030)
pH: 6 (ref 5.0–8.0)

## 2020-08-12 LAB — D-DIMER, QUANTITATIVE: D-Dimer, Quant: 0.48 ug/mL-FEU (ref 0.00–0.50)

## 2020-08-12 LAB — I-STAT BETA HCG BLOOD, ED (MC, WL, AP ONLY): I-stat hCG, quantitative: <5 m[IU]/mL (ref <5)

## 2020-08-12 LAB — CBG MONITORING, ED: Glucose-Capillary: 113 mg/dL — ABNORMAL HIGH (ref 70–99)

## 2020-08-12 MED ORDER — MORPHINE SULFATE (PF) 4 MG/ML IV SOLN: 4.0000 mg | Freq: Once | INTRAVENOUS | Status: DC

## 2020-08-12 MED ORDER — ONDANSETRON HCL 4 MG/2ML IJ SOLN: 4.0000 mg | Freq: Once | INTRAMUSCULAR | Status: DC

## 2020-08-12 MED ORDER — POTASSIUM CHLORIDE CRYS ER 20 MEQ PO TBCR
40.0000 meq | EXTENDED_RELEASE_TABLET | Freq: Once | ORAL | Status: AC
  Administered 2020-08-12: 20:00:00 40 meq via ORAL
  Filled 2020-08-12: qty 2

## 2020-08-12 MED ORDER — IBUPROFEN 400 MG PO TABS
400.0000 mg | ORAL_TABLET | Freq: Once | ORAL | Status: AC
  Administered 2020-08-12: 20:00:00 400 mg via ORAL
  Filled 2020-08-12: qty 1

## 2020-08-12 MED ORDER — ACETAMINOPHEN 500 MG PO TABS
1000.0000 mg | ORAL_TABLET | Freq: Once | ORAL | Status: AC
  Administered 2020-08-12: 20:00:00 1000 mg via ORAL
  Filled 2020-08-12: qty 2

## 2020-08-12 MED ORDER — POTASSIUM CHLORIDE 10 MEQ/100ML IV SOLN: 10.0000 meq | Freq: Once | INTRAVENOUS | Status: DC

## 2020-08-12 NOTE — Discharge Instructions (Addendum)
All the blood work looked normal today.  No signs of heart attack and or blood clots.  Chest x-ray was normal without enlarged heart or signs of pneumonia.  The pain may be caused by the new diet medication and you should stop the phenteramine at this time.  It would be ok for you to continue the topiramate.  Make sure you are staying hydrated and use your inhaler if you need for coughing and wheezing.  Return to the ER if you start having episode of passing out, high fever or inability to catch your breath. Take tylenol or ibuprofen as needed for the pain.

## 2020-08-12 NOTE — ED Provider Notes (Signed)
[facility] Provider Note   CSN: [id number] Arrival date & time: 08/12/20  1115     History Chief Complaint  Patient presents with  . Chest Pain    Francella Barnett is a 54 y.o. female.  Patient is a 54 year old female with a history of anxiety, asthma and reports blood clots when she was younger but not on anticoagulation now who is presenting today from urgent care with complaint of chest pain.  Patient reports that she is having worsening left-sided chest pain.  Patient reports symptoms started about 2 weeks ago but it was minimal until 2 days ago when she started having worsening sharp pain in the left side of her chest and her left shoulder blade that will occasionally go down her arm.  It is worse when she takes a deep breath or coughs.  She is having associated shortness of breath which is worse with exertion.  She has had a minimal cough with a small amount of sputum but denies any fever.  She has had intermittent feelings of her heart racing but denies any nausea or vomiting.  She has had no localized abdominal pain but has noted urinary frequency and urgency.  She has not noticed any lower leg swelling and denies unilateral leg pain.  Patient denies tobacco or alcohol use.  She does report that recently her doctor started her on phentermine and topiramate for weight loss.  Otherwise she has not had any medication changes.  No known cardiac disease.  Patient reports that she does have an inhaler at home that she uses for asthma.  Earlier in the week she did use it while at work and felt like it did help the shortness of breath and pain a little bit.  The history is provided by the patient and medical records.  Chest Pain Pain location:  L chest Pain quality: sharp and stabbing   Pain radiates to:  L shoulder and upper back Pain severity:  Severe Onset quality:  Gradual Duration:  2 weeks Timing:  Constant Progression:   Worsening Chronicity:  Recurrent Context: breathing   Relieved by: Some improvement with inhaler earlier in the week. Worsened by:  Coughing, deep breathing and movement Ineffective treatments:  None tried Associated symptoms: cough, numbness, palpitations and shortness of breath   Associated symptoms: no abdominal pain, no altered mental status, no anorexia, no diaphoresis, no dizziness, no fever, no lower extremity edema, no nausea, no vomiting and no weakness   Risk factors comment:  Anxiety, asthma      Past Medical History:  Diagnosis Date  . Anxiety   . Asthma   . Prediabetes     There are no problems to display for this patient.   Past Surgical History:  Procedure Laterality Date  . APPENDECTOMY    . DILATION AND CURETTAGE OF UTERUS    . TOTAL HIP ARTHROPLASTY    . TUBAL LIGATION       OB History   No obstetric history on file.     Family History  Problem Relation Age of Onset  . Hypertension Other   . Hypertension Mother   . Hypertension Sister     Social History   Tobacco Use  . Smoking status: Never Smoker  . Smokeless tobacco: Never Used  Substance Use Topics  . Alcohol use: Yes    Comment: social  . Drug use: No    Home Medications Prior to Admission medications   Medication Sig Start  Date End Date Taking? Authorizing Provider  albuterol (VENTOLIN HFA) 108 (90 Base) MCG/ACT inhaler Inhale 1-2 puffs into the lungs every 6 (six) hours as needed for wheezing or shortness of breath. 01/28/20   Faustino Congress, NP  Citalopram Hydrobromide (CELEXA PO) Take by mouth.    [provider]  cyclobenzaprine (FLEXERIL) 10 MG tablet Take 1 tablet (10 mg total) by mouth 2 (two) times daily as needed for muscle spasms. 01/28/20   Faustino Congress, NP  famotidine (PEPCID) 20 MG tablet Take 1 tablet (20 mg total) by mouth 2 (two) times daily. 05/10/17   Isla Pence, MD  ibuprofen (ADVIL) 600 MG tablet Take 1 tablet (600 mg total) by mouth every 8  (eight) hours as needed. 04/24/20   Rosemarie Ax, MD  LORazepam (ATIVAN) 0.5 MG tablet Take 0.5 mg 2 (two) times daily as needed by mouth for anxiety.    [provider]  tamsulosin (FLOMAX) 0.4 MG CAPS capsule Take 1 capsule (0.4 mg total) by mouth daily after breakfast. 04/24/20   Rosemarie Ax, MD    Allergies    Bee venom, Fire ant, and Monosodium glutamate  Review of Systems   Review of Systems  Constitutional: Negative for diaphoresis and fever.  Respiratory: Positive for cough and shortness of breath.   Cardiovascular: Positive for chest pain and palpitations.  Gastrointestinal: Negative for abdominal pain, anorexia, nausea and vomiting.  Neurological: Positive for numbness. Negative for dizziness and weakness.       Intermittent tingling of the fingers on both hands but none right now  All other systems reviewed and are negative.   Physical Exam Updated Vital Signs BP (!) 146/91   Pulse 95   Temp 98.9 F (37.2 C) (Oral)   Resp 19   Ht 5\' 2"  (1.575 m)   Wt 83 kg   SpO2 99%   BMI 33.47 kg/m   Physical Exam Vitals and nursing note reviewed.  Constitutional:      General: She is not in acute distress.    Appearance: She is well-developed and well-nourished. She is obese. She is not ill-appearing.     Comments: Appears anxious  HENT:     Head: Normocephalic and atraumatic.     Mouth/Throat:     Mouth: Mucous membranes are moist.  Eyes:     Extraocular Movements: EOM normal.     Pupils: Pupils are equal, round, and reactive to light.  Cardiovascular:     Rate and Rhythm: Regular rhythm. Tachycardia present.     Pulses: Normal pulses and intact distal pulses.     Heart sounds: Normal heart sounds. No murmur heard. No friction rub.  Pulmonary:     Effort: Pulmonary effort is normal. Tachypnea present.     Breath sounds: Normal breath sounds. No wheezing or rales.     Comments: Slightly hyperventilating Abdominal:     General: Bowel sounds are  normal. There is no distension.     Palpations: Abdomen is soft.     Tenderness: There is no abdominal tenderness. There is no guarding or rebound.  Musculoskeletal:        General: No tenderness. Normal range of motion.     Right lower leg: No edema.     Left lower leg: No edema.     Comments: No edema  Skin:    General: Skin is warm and dry.     Findings: No rash.  Neurological:     General: No focal deficit present.  Mental Status: She is alert and oriented to person, place, and time. Mental status is at baseline.     Cranial Nerves: No cranial nerve deficit.  Psychiatric:        Mood and Affect: Mood and affect and mood normal.        Behavior: Behavior normal.        Thought Content: Thought content normal.     ED Results / Procedures / Treatments   Labs (all labs ordered are listed, but only abnormal results are displayed) Labs Reviewed  BASIC METABOLIC PANEL - Abnormal; Notable for the following components:      Result Value   Potassium 3.0 (*)    Glucose, Bld 113 (*)    All other components within normal limits  CBC - Abnormal; Notable for the following components:   WBC 13.9 (*)    All other components within normal limits  URINALYSIS, ROUTINE W REFLEX MICROSCOPIC - Abnormal; Notable for the following components:   Hgb urine dipstick SMALL (*)    All other components within normal limits  D-DIMER, QUANTITATIVE (NOT AT [facility])  I-STAT BETA HCG BLOOD, ED ([facility], [facility], [facility] ONLY)  TROPONIN I (HIGH SENSITIVITY)  TROPONIN I (HIGH SENSITIVITY)    EKG EKG Interpretation  Date/Time:  Saturday August 12 2020 11:40:29 EST Ventricular Rate:  97 PR Interval:  128 QRS Duration: 74 QT Interval:  338 QTC Calculation: 429 R Axis:   29 Text Interpretation: Normal sinus rhythm ST & T wave abnormality, consider inferior ischemia No significant change since last tracing Confirmed by Blanchie Dessert [phone]) on 08/12/2020 3:15:39 PM   Radiology DG Chest 2 View  Result  Date: 08/12/2020 CLINICAL DATA:  Chest pain. EXAM: CHEST - 2 VIEW COMPARISON:  August 31, 2016. FINDINGS: The heart size and mediastinal contours are within normal limits. Both lungs are clear. No pneumothorax or pleural effusion is noted. The visualized skeletal structures are unremarkable. IMPRESSION: No active cardiopulmonary disease. Electronically Signed   By: Marijo Conception M.D.   On: 08/12/2020 13:26    Procedures Procedures (including critical care time)  Medications Ordered in ED Medications  potassium chloride 10 mEq in 100 mL IVPB (has no administration in time range)  morphine 4 MG/ML injection 4 mg (has no administration in time range)  ondansetron (ZOFRAN) injection 4 mg (has no administration in time range)    ED Course  I have reviewed the triage vital signs and the nursing notes.  Pertinent labs & imaging results that were available during my care of the patient were reviewed by me and considered in my medical decision making (see chart for details).    MDM Rules/Calculators/A&P                          Patient is a pleasant 54 year old female with nonspecific chest pain.  Pain is in the left side and radiates into her left shoulder blade.  Patient is tachycardic and appears to be mildly hyperventilating here which could be related to pain.  Patient's EKG has nonspecific ST changes but there is nothing new from prior.  She denies any acute infectious symptoms concerning for pneumonia or Covid.  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  She is a non-smoker and has not had recent surgery.  She does have a reported history of asthma and does have inhalers at home which she said does help a  little bit but has no wheezing on exam.  Sats are 99% on room air.  Patient is persisted leg mildly tachycardic here.  She was also recently started on phentermine and topiramate for weight loss.  Concerned that the phentermine may be  causing her tachycardia and even some of her symptoms of anxiousness.  However we will do a D-dimer to rule out PE.  Chest x-ray is clear, troponin is 6 and that is with greater than 6 hours of pain.  Patient's BMP with mild hypokalemia of 3.0 but normal renal function and mild leukocytosis of 13,000 but normal hemoglobin.  Low suspicion for dissection, pericarditis, MI.  Patient does have a heart score of 3 but is fairly low risk and history is not consistent with cardiac cause.  She denies any GI upset and lower suspicion that that is the cause of her symptoms.  She does report an issue with her neck and shoulder that has been ongoing for some time and patient symptoms could be musculoskeletal in nature as well. Patient given pain control/potassium replacement and D-dimer is pending.  6:46 PM Delta trop and d-dimer wnl.  Low suspicion at this time for PE/dissection or ACS.  Will have pt stop the phenteramine and f/u with her doctor at [facility].  Will also have her continue her anxiety medication and inhaler as needed.  Again no wheezing noted here.  MDM Number of Diagnoses or Management Options   Amount and/or Complexity of Data Reviewed Clinical lab tests: ordered and reviewed Tests in the radiology section of CPT: ordered and reviewed Tests in the medicine section of CPT: reviewed and ordered Decide to obtain previous medical records or to obtain history from someone other than the patient: yes Obtain history from someone other than the patient: no Review and summarize past medical records: yes Discuss the patient with other providers: no Independent visualization of images, tracings, or specimens: yes  Risk of Complications, Morbidity, and/or Mortality Presenting problems: moderate Diagnostic procedures: low Management options: low  Patient Progress Patient progress: stable   Final Clinical Impression(s) / ED Diagnoses Final diagnoses:  Nonspecific chest pain    Rx / DC  Orders ED Discharge Orders    None       Blanchie Dessert, MD 08/12/20 1849

## 2020-08-12 NOTE — ED Notes (Signed)
Patient is being discharged from the Urgent Care and sent to the Emergency Department via private vehicle. Per Lavell Anchors, NP, patient is in need of higher level of care due to further evaluation. Patient is aware and verbalizes understanding of plan of care.  Vitals:   08/12/20 1045  BP: (!) 135/92  Pulse: (!) 108  SpO2: 100%

## 2020-08-12 NOTE — Discharge Instructions (Addendum)
Emergency department further work-up rule out ACS

## 2020-08-12 NOTE — ED Provider Notes (Signed)
[facility]    CSN: [id number] Arrival date & time: 08/12/20  1035      History   Chief Complaint Chief Complaint  Patient presents with  . Chest Pain    Pt states that she is having chest pain that begin four days ago. Pt states that she does have a hard time breathing but does have an inhaler in the home.  Pt states that her her fingers are numb.     HPI Karen Reese is a 53 y.o. female.   HPI  Patient presents today very drowsy mildly lethargic complained of chest pain for 4 days along with numbness and tingling in her lips and bilateral fingers.  Patient also endorses some shoulder pain that is nonproducible with movement.  While here in clinic she is complaining of sharp chest pain radiating down her left medial lateral chest.  She has recently been seen at the weight management clinic and I see that she is taking Topamax however patient reports she has been taking Topamax before and does not recall starting any medications.  She also has Ativan on her medication list for anxiety also endorses this medication is not new.  She is in and out of alertness during the triage process.  EKG is sinus tachycardia without any ST changes.  She denies any shortness of breath.  In review of recent labs patient has had low potassium levels as low as 2.9 unable to reconcile per care everywhere whether or not potassium has been replaced.  Given patient's presentation and limitations of urgent care she has been sent to the ER for further work-up and evaluation.  She is accompanied by her daughter here today who will transport her via automobile over to [facility] emergency department.   Past Medical History:  Diagnosis Date  . Anxiety   . Asthma   . Prediabetes     There are no problems to display for this patient.   Past Surgical History:  Procedure Laterality Date  . APPENDECTOMY    . DILATION AND CURETTAGE OF UTERUS    . TOTAL HIP ARTHROPLASTY    . TUBAL LIGATION       OB History   No obstetric history on file.      Home Medications    Prior to Admission medications   Medication Sig Start Date End Date Taking? Authorizing Provider  albuterol (VENTOLIN HFA) 108 (90 Base) MCG/ACT inhaler Inhale 1-2 puffs into the lungs every 6 (six) hours as needed for wheezing or shortness of breath. 01/28/20  Yes Faustino Congress, NP  cyclobenzaprine (FLEXERIL) 10 MG tablet Take 1 tablet (10 mg total) by mouth 2 (two) times daily as needed for muscle spasms. 01/28/20  Yes Faustino Congress, NP  famotidine (PEPCID) 20 MG tablet Take 1 tablet (20 mg total) by mouth 2 (two) times daily. 05/10/17  Yes Isla Pence, MD  LORazepam (ATIVAN) 0.5 MG tablet Take 0.5 mg 2 (two) times daily as needed by mouth for anxiety.   Yes [provider]  Citalopram Hydrobromide (CELEXA PO) Take by mouth.    [provider]  ibuprofen (ADVIL) 600 MG tablet Take 1 tablet (600 mg total) by mouth every 8 (eight) hours as needed. 04/24/20   Rosemarie Ax, MD  tamsulosin [facility]) 0.4 MG CAPS capsule Take 1 capsule (0.4 mg total) by mouth daily after breakfast. 04/24/20   Rosemarie Ax, MD    Family History Family History  Problem Relation Age of Onset  .  Hypertension Other   . Hypertension Mother   . Hypertension Sister     Social History Social History   Tobacco Use  . Smoking status: Never Smoker  . Smokeless tobacco: Never Used  Substance Use Topics  . Alcohol use: Yes    Comment: social  . Drug use: No     Allergies   Bee venom, Fire ant, and Monosodium glutamate   Review of Systems Review of Systems Pertinent negatives listed in HPI   Physical Exam Triage Vital Signs ED Triage Vitals [08/12/20 1045]  Enc Vitals Group     BP (!) 135/92     Pulse Rate (!) 108     Resp      Temp      Temp src      SpO2 100 %     Weight      Height      Head Circumference      Peak Flow      Pain Score      Pain Loc      Pain Edu?      Excl.  in [place]?    No data found.  Updated Vital Signs BP (!) 135/92 (BP Location: Right Arm)   Pulse (!) 108   SpO2 100%   Visual Acuity Right Eye Distance:   Left Eye Distance:   Bilateral Distance:    Right Eye Near:   Left Eye Near:    Bilateral Near:     Physical Exam Constitutional:      Appearance: She is obese. She is ill-appearing.  HENT:     Head: Normocephalic.  Cardiovascular:     Rate and Rhythm: Tachycardia present.  Pulmonary:     Breath sounds: Normal breath sounds. No wheezing or rhonchi.  Skin:    General: Skin is warm and dry.  Neurological:     Mental Status: She is oriented to person, place, and time. She is lethargic.     GCS: GCS eye subscore is 4. GCS verbal subscore is 5. GCS motor subscore is 6.     Motor: Weakness present.     Comments: Weakness, in wheelchair   Psychiatric:        Behavior: Behavior is cooperative.      UC Treatments / Results  Labs (all labs ordered are listed, but only abnormal results are displayed) Labs Reviewed  CBG MONITORING, ED - Abnormal; Notable for the following components:      Result Value   Glucose-Capillary 113 (*)    All other components within normal limits    EKG   Radiology No results found.  Procedures Procedures (including critical care time)  Medications Ordered in UC Medications - No data to display  Initial Impression / Assessment and Plan / UC Course  I have reviewed the triage vital signs and the nursing notes.  Pertinent labs & imaging results that were available during my care of the patient were reviewed by me and considered in my medical decision making (see chart for details).     Vital signs are stable although patient is very sleepy, lethargic during triage process and continues to complain of chest pain.  Patient being transported by family to [facility] emergency department for further work-up and evaluation.  Vital signs are stable EKG sinus tachycardia otherwise no emergent  findings warranting EMS transport Final Clinical Impressions(s) / UC Diagnoses   Final diagnoses:  Lethargic  Chest pain, unspecified type     Discharge Instructions  Emergency department further work-up rule out ACS   ED Prescriptions    None     PDMP not reviewed this encounter.   Scot Jun, FNP 08/12/20 1114

## 2020-08-12 NOTE — ED Triage Notes (Signed)
Pt here for eval of central chest pain with radiation to back and L shoulder. Pain worse with movement. Also reports bilateral hand tingling and mouth twitching as well as urinary frequency since yesterday.

## 2021-03-20 NOTE — Patient Instructions (Signed)
DUE TO COVID-19 ONLY ONE VISITOR IS ALLOWED TO COME WITH YOU AND STAY IN THE WAITING ROOM ONLY DURING PRE OP AND PROCEDURE DAY OF SURGERY. THE 1 VISITOR  MAY VISIT WITH YOU AFTER SURGERY IN YOUR PRIVATE ROOM DURING VISITING HOURS ONLY!                 Karen Reese     Your procedure is scheduled on: 03/28/21   Report to [facility] Main  Entrance   Report to Short stay at 5:15 AM     Call this number if you have problems the morning of surgery Karen Reese, NO CHEWING GUM Karen Reese.   No food after midnight.    You may have clear liquid until 4:30 AM.    At 4:00 AM drink pre surgery drink.   Nothing by mouth after 4:30 AM.    Take these medicines the morning of surgery with A SIP OF WATER: Topomax, Lexapro, Amlodipine, Pepcid Ativan if needed.                      Use your inhaler and bring it with you.                                 You may not have any metal on your body including hair pins and              piercings  Do not wear jewelry, make-up, lotions, powders or perfumes, deodorant             Do not wear nail polish on your fingernails.  Do not shave  48 hours prior to surgery.                Do not bring valuables to the hospital. Karen Reese.  Contacts, dentures or bridgework may not be worn into surgery.       Patients discharged the day of surgery will not be allowed to drive home.  IF YOU ARE HAVING SURGERY AND GOING HOME THE SAME DAY, YOU MUST HAVE AN ADULT TO DRIVE YOU HOME AND BE WITH YOU FOR 24 HOURS. YOU MAY GO HOME BY TAXI OR UBER OR ORTHERWISE, BUT AN ADULT MUST ACCOMPANY YOU HOME AND STAY WITH YOU FOR 24 HOURS.  Name and phone number of your driver:  Special Instructions: N/A              Please read over the following fact sheets you were  given: _____________________________________________________________________             [facility] - Preparing for Surgery Before surgery, you can play an important role.  Because skin is not sterile, your skin needs to be as free of germs as possible.  You can reduce the number of germs on your skin by washing with CHG (chlorahexidine gluconate) soap before surgery.  CHG is an antiseptic cleaner which kills germs and bonds with the skin to continue killing germs even after washing. Please DO NOT use if you have an allergy to CHG or antibacterial soaps.  If your skin becomes reddened/irritated stop using the CHG and inform your nurse when you arrive at Short Stay. Do not shave (including legs  and underarms) for at least 48 hours prior to the first CHG shower.   Please follow these instructions carefully:  1.  Shower with CHG Soap the night before surgery and the  morning of Surgery.  2.  If you choose to wash your hair, wash your hair first as usual with your  normal  shampoo.  3.  After you shampoo, rinse your hair and body thoroughly to remove the  shampoo.                                        4.  Use CHG as you would any other liquid soap.  You can apply chg directly  to the skin and wash                       Gently with a scrungie or clean washcloth.  5.  Apply the CHG Soap to your body ONLY FROM THE NECK DOWN.   Do not use on face/ open                           Wound or open sores. Avoid contact with eyes, ears mouth and genitals (private parts).                       Wash face,  Genitals (private parts) with your normal soap.             6.  Wash thoroughly, paying special attention to the area where your surgery  will be performed.  7.  Thoroughly rinse your body with warm water from the neck down.  8.  DO NOT shower/wash with your normal soap after using and rinsing off  the CHG Soap.             9.  Pat yourself dry with a clean towel.            10.  Wear clean pajamas.             11.  Place clean sheets on your bed the night of your first shower and do not  sleep with pets. Day of Surgery : Do not apply any lotions/deodorants the morning of surgery.  Please wear clean clothes to the hospital/surgery center.  FAILURE TO FOLLOW THESE INSTRUCTIONS MAY RESULT IN THE CANCELLATION OF YOUR SURGERY PATIENT SIGNATURE_________________________________  NURSE SIGNATURE__________________________________  ________________________________________________________________________   Karen Reese  An incentive spirometer is a tool that can help keep your lungs clear and active. This tool measures how well you are filling your lungs with each breath. Taking long deep breaths may help reverse or decrease the chance of developing breathing (pulmonary) problems (especially infection) following: A long period of time when you are unable to move or be active. BEFORE THE PROCEDURE  If the spirometer includes an indicator to show your best effort, your nurse or respiratory therapist will set it to a desired goal. If possible, sit up straight or lean slightly forward. Try not to slouch. Hold the incentive spirometer in an upright position. INSTRUCTIONS FOR USE  Sit on the edge of your bed if possible, or sit up as far as you can in bed or on a chair. Hold the incentive spirometer in an upright position. Breathe out normally. Place the mouthpiece in your mouth and seal your lips tightly  around it. Breathe in slowly and as deeply as possible, raising the piston or the ball toward the top of the column. Hold your breath for 3-5 seconds or for as long as possible. Allow the piston or ball to fall to the bottom of the column. Remove the mouthpiece from your mouth and breathe out normally. Rest for a few seconds and repeat Steps 1 through 7 at least 10 times every 1-2 hours when you are awake. Take your time and take a few normal breaths between deep breaths. The spirometer may include  an indicator to show your best effort. Use the indicator as a goal to work toward during each repetition. After each set of 10 deep breaths, practice coughing to be sure your lungs are clear. If you have an incision (the cut made at the time of surgery), support your incision when coughing by placing a pillow or rolled up towels firmly against it. Once you are able to get out of bed, walk around indoors and cough well. You may stop using the incentive spirometer when instructed by your caregiver.  RISKS AND COMPLICATIONS Take your time so you do not get dizzy or light-headed. If you are in pain, you may need to take or ask for pain medication before doing incentive spirometry. It is harder to take a deep breath if you are having pain. AFTER USE Rest and breathe slowly and easily. It can be helpful to keep track of a log of your progress. Your caregiver can provide you with a simple table to help with this. If you are using the spirometer at home, follow these instructions: [place] IF:  You are having difficultly using the spirometer. You have trouble using the spirometer as often as instructed. Your pain medication is not giving enough relief while using the spirometer. You develop fever of 100.5 F (38.1 C) or higher. SEEK IMMEDIATE MEDICAL CARE IF:  You cough up bloody sputum that had not been present before. You develop fever of 102 F (38.9 C) or greater. You develop worsening pain at or near the incision site. MAKE SURE YOU:  Understand these instructions. Will watch your condition. Will get help right away if you are not doing well or get worse. Document Released: 12/30/2006 Document Revised: 11/11/2011 Document Reviewed: 03/02/2007 [facility] Patient Information 2014 [place], [place].   ________________________________________________________________________

## 2021-03-22 ENCOUNTER — Other Ambulatory Visit: Payer: Self-pay

## 2021-03-22 ENCOUNTER — Encounter (HOSPITAL_COMMUNITY): Payer: Self-pay

## 2021-03-22 ENCOUNTER — Encounter (HOSPITAL_COMMUNITY)
Admission: RE | Admit: 2021-03-22 | Discharge: 2021-03-22 | Disposition: A | Discharge status: Home / Self Care | Payer: 59 | Source: Ambulatory Visit | Attending: Orthopedic Surgery | Admitting: Orthopedic Surgery

## 2021-03-22 DIAGNOSIS — Z01812 Encounter for preprocedural laboratory examination: Secondary | ICD-10-CM | POA: Diagnosis present

## 2021-03-22 HISTORY — DX: Gastro-esophageal reflux disease without esophagitis: K21.9

## 2021-03-22 HISTORY — DX: Prediabetes: R73.03

## 2021-03-22 HISTORY — DX: Unspecified osteoarthritis, unspecified site: M19.90

## 2021-03-22 HISTORY — DX: Dyspnea, unspecified: R06.00

## 2021-03-22 LAB — CBC
HCT: 37.9 % (ref 36.0–46.0)
Hemoglobin: 12 g/dL (ref 12.0–15.0)
MCH: 29.8 pg (ref 26.0–34.0)
MCHC: 31.7 g/dL (ref 30.0–36.0)
MCV: 94 fL (ref 80.0–100.0)
Platelets: 264 10*3/uL (ref 150–400)
RBC: 4.03 MIL/uL (ref 3.87–5.11)
RDW: 15.4 % (ref 11.5–15.5)
WBC: 11.7 10*3/uL — ABNORMAL HIGH (ref 4.0–10.5)
nRBC: 0 % (ref 0.0–0.2)

## 2021-03-22 LAB — BASIC METABOLIC PANEL
Anion gap: 6 (ref 5–15)
BUN: 10 mg/dL (ref 6–20)
CO2: 29 mmol/L (ref 22–32)
Calcium: 8.9 mg/dL (ref 8.9–10.3)
Chloride: 105 mmol/L (ref 98–111)
Creatinine, Ser: 0.72 mg/dL (ref 0.44–1.00)
GFR, Estimated: >60 mL/min (ref >60)
Glucose, Bld: 91 mg/dL (ref 70–99)
Potassium: 3.3 mmol/L — ABNORMAL LOW (ref 3.5–5.1)
Sodium: 140 mmol/L (ref 135–145)

## 2021-03-22 LAB — HEMOGLOBIN A1C
Hgb A1c MFr Bld: 7 % — ABNORMAL HIGH (ref 4.8–5.6)
Mean Plasma Glucose: 154.2 mg/dL

## 2021-03-22 NOTE — Progress Notes (Addendum)
COVID Vaccine Completed:Yes Date COVID Vaccine completed:12/10/19 Booster 08/31/20 COVID vaccine manufacturer: [place]      PCP - Marinda Elk. [facility] PA Cardiologist - no  Chest x-ray - 08/12/20-epic EKG - 08/14/20-epic Stress Test - no ECHO - no Cardiac Cath - no Pacemaker/ICD device last checked:NA  Sleep Study - no CPAP -   Fasting Blood Sugar - NA Checks Blood Sugar _____ times a day  Blood Thinner Instructions:NA Aspirin Instructions: Last Dose:  Anesthesia review: yes  Patient denies shortness of breath, fever, cough and chest pain at PAT appointment Pt has asthma with som SOB but nothing new. She has not needed her inhaler. Asthma is triggered by anxiety mostly. She can climb 2 flights of stairs and ADLs without SOB.  The Pt reports that she " caught a cold from her granddaughter". She took medicine last night for a stuffy nose without a cough. I told her if her symptoms get worse to call Dr. Wynelle Link  Patient verbalized understanding of instructions that were given to them at the PAT appointment. Patient was also instructed that they will need to review over the PAT instructions again at home before surgery. Yes  Pt would like her hardware to take home with her. Note on chart.

## 2021-03-27 NOTE — Anesthesia Preprocedure Evaluation (Addendum)
Anesthesia Evaluation  Patient identified by MRN, date of birth, ID band Patient awake    Reviewed: Allergy & Precautions, NPO status , Patient's Chart, lab work & pertinent test results  Airway Mallampati: II  TM Distance: >3 FB Neck ROM: Full    Dental  (+) Dental Advisory Given   Pulmonary shortness of breath, asthma ,    breath sounds clear to auscultation       Cardiovascular negative cardio ROS   Rhythm:Regular Rate:Normal     Neuro/Psych negative neurological ROS     GI/Hepatic Neg liver ROS, GERD  ,  Endo/Other  negative endocrine ROS  Renal/GU negative Renal ROS     Musculoskeletal  (+) Arthritis ,   Abdominal   Peds  Hematology negative hematology ROS (+)   Anesthesia Other Findings   Reproductive/Obstetrics                            Lab Results  Component Value Date   WBC 11.7 (H) 03/22/2021   HGB 12.0 03/22/2021   HCT 37.9 03/22/2021   MCV 94.0 03/22/2021   PLT 264 03/22/2021   Lab Results  Component Value Date   CREATININE 0.72 03/22/2021   BUN 10 03/22/2021   NA 140 03/22/2021   K 3.3 (L) 03/22/2021   CL 105 03/22/2021   CO2 29 03/22/2021    Anesthesia Physical Anesthesia Plan  ASA: 2  Anesthesia Plan: General   Post-op Pain Management:    Induction:   PONV Risk Score and Plan: 3 and Dexamethasone, Ondansetron and Treatment may vary due to age or medical condition  Airway Management Planned: Oral ETT  Additional Equipment: None  Intra-op Plan:   Post-operative Plan: Extubation in OR  Informed Consent: I have reviewed the patients History and Physical, chart, labs and discussed the procedure including the risks, benefits and alternatives for the proposed anesthesia with the patient or authorized representative who has indicated his/her understanding and acceptance.     Dental advisory given  Plan Discussed with: CRNA  Anesthesia Plan  Comments:         Anesthesia Quick Evaluation

## 2021-03-28 ENCOUNTER — Encounter (HOSPITAL_COMMUNITY)
Admission: RE | Disposition: A | Discharge status: Home / Self Care | Payer: Self-pay | Source: Ambulatory Visit | Attending: Orthopedic Surgery

## 2021-03-28 ENCOUNTER — Ambulatory Visit (HOSPITAL_COMMUNITY): Payer: 59 | Admitting: Anesthesiology

## 2021-03-28 ENCOUNTER — Ambulatory Visit (HOSPITAL_COMMUNITY)
Admission: RE | Admit: 2021-03-28 | Discharge: 2021-03-28 | Disposition: A | Discharge status: Home / Self Care | Payer: 59 | Source: Ambulatory Visit | Attending: Orthopedic Surgery | Admitting: Orthopedic Surgery

## 2021-03-28 ENCOUNTER — Encounter (HOSPITAL_COMMUNITY): Payer: Self-pay | Admitting: Orthopedic Surgery

## 2021-03-28 DIAGNOSIS — Z79899 Other long term (current) drug therapy: Secondary | ICD-10-CM | POA: Insufficient documentation

## 2021-03-28 DIAGNOSIS — T8484XA Pain due to internal orthopedic prosthetic devices, implants and grafts, initial encounter: Secondary | ICD-10-CM | POA: Diagnosis present

## 2021-03-28 DIAGNOSIS — Z96642 Presence of left artificial hip joint: Secondary | ICD-10-CM | POA: Diagnosis not present

## 2021-03-28 DIAGNOSIS — Y798 Miscellaneous orthopedic devices associated with adverse incidents, not elsewhere classified: Secondary | ICD-10-CM | POA: Diagnosis not present

## 2021-03-28 HISTORY — PX: HARDWARE REMOVAL: SHX979

## 2021-03-28 LAB — TYPE AND SCREEN
ABO/RH(D): A POS
Antibody Screen: NEGATIVE

## 2021-03-28 LAB — ABO/RH: ABO/RH(D): A POS

## 2021-03-28 SURGERY — REMOVAL, HARDWARE
Anesthesia: General | Site: Hip | Laterality: Left

## 2021-03-28 MED ORDER — FENTANYL CITRATE (PF) 100 MCG/2ML IJ SOLN
INTRAMUSCULAR | Status: DC | PRN
  Administered 2021-03-28: 100 ug via INTRAVENOUS
  Administered 2021-03-28: 50 ug via INTRAVENOUS

## 2021-03-28 MED ORDER — LACTATED RINGERS IV SOLN: INTRAVENOUS | Status: DC

## 2021-03-28 MED ORDER — SUGAMMADEX SODIUM 200 MG/2ML IV SOLN
INTRAVENOUS | Status: DC | PRN
  Administered 2021-03-28: 200 mg via INTRAVENOUS

## 2021-03-28 MED ORDER — ASPIRIN EC 81 MG PO TBEC: 81.0000 mg | DELAYED_RELEASE_TABLET | Freq: Every day | ORAL | 0 refills | Status: AC

## 2021-03-28 MED ORDER — PROPOFOL 10 MG/ML IV BOLUS
INTRAVENOUS | Status: AC
  Filled 2021-03-28: qty 40

## 2021-03-28 MED ORDER — 0.9 % SODIUM CHLORIDE (POUR BTL) OPTIME
TOPICAL | Status: DC | PRN
  Administered 2021-03-28: 1000 mL

## 2021-03-28 MED ORDER — ORAL CARE MOUTH RINSE: 15.0000 mL | Freq: Once | OROMUCOSAL | Status: AC

## 2021-03-28 MED ORDER — CHLORHEXIDINE GLUCONATE 0.12 % MT SOLN
15.0000 mL | Freq: Once | OROMUCOSAL | Status: AC
  Administered 2021-03-28: 15 mL via OROMUCOSAL

## 2021-03-28 MED ORDER — ESMOLOL HCL 100 MG/10ML IV SOLN
INTRAVENOUS | Status: DC | PRN
  Administered 2021-03-28: 20 mg via INTRAVENOUS
  Administered 2021-03-28: 30 mg via INTRAVENOUS

## 2021-03-28 MED ORDER — LIDOCAINE 2% (20 MG/ML) 5 ML SYRINGE
INTRAMUSCULAR | Status: DC | PRN
  Administered 2021-03-28: 60 mg via INTRAVENOUS

## 2021-03-28 MED ORDER — SUCCINYLCHOLINE CHLORIDE 200 MG/10ML IV SOSY
PREFILLED_SYRINGE | INTRAVENOUS | Status: AC
  Filled 2021-03-28: qty 10

## 2021-03-28 MED ORDER — SODIUM CHLORIDE 0.9 % IV SOLN
2.0000 g | INTRAVENOUS | Status: AC
  Administered 2021-03-28: 2 g via INTRAVENOUS
  Filled 2021-03-28: qty 2

## 2021-03-28 MED ORDER — FENTANYL CITRATE (PF) 100 MCG/2ML IJ SOLN: 25.0000 ug | INTRAMUSCULAR | Status: DC | PRN

## 2021-03-28 MED ORDER — PHENYLEPHRINE HCL (PRESSORS) 10 MG/ML IV SOLN
INTRAVENOUS | Status: AC
  Filled 2021-03-28: qty 1

## 2021-03-28 MED ORDER — DEXAMETHASONE SODIUM PHOSPHATE 10 MG/ML IJ SOLN
INTRAMUSCULAR | Status: AC
  Filled 2021-03-28: qty 1

## 2021-03-28 MED ORDER — ONDANSETRON HCL 4 MG/2ML IJ SOLN
INTRAMUSCULAR | Status: AC
  Filled 2021-03-28: qty 2

## 2021-03-28 MED ORDER — FENTANYL CITRATE (PF) 250 MCG/5ML IJ SOLN
INTRAMUSCULAR | Status: AC
  Filled 2021-03-28: qty 5

## 2021-03-28 MED ORDER — MIDAZOLAM HCL 5 MG/5ML IJ SOLN
INTRAMUSCULAR | Status: DC | PRN
  Administered 2021-03-28: 2 mg via INTRAVENOUS

## 2021-03-28 MED ORDER — BUPIVACAINE-EPINEPHRINE (PF) 0.25% -1:200000 IJ SOLN
INTRAMUSCULAR | Status: AC
  Filled 2021-03-28: qty 30

## 2021-03-28 MED ORDER — MIDAZOLAM HCL 2 MG/2ML IJ SOLN
INTRAMUSCULAR | Status: AC
  Filled 2021-03-28: qty 2

## 2021-03-28 MED ORDER — AMISULPRIDE (ANTIEMETIC) 5 MG/2ML IV SOLN: 10.0000 mg | Freq: Once | INTRAVENOUS | Status: DC | PRN

## 2021-03-28 MED ORDER — ONDANSETRON HCL 4 MG/2ML IJ SOLN
INTRAMUSCULAR | Status: DC | PRN
  Administered 2021-03-28: 4 mg via INTRAVENOUS

## 2021-03-28 MED ORDER — LIDOCAINE 2% (20 MG/ML) 5 ML SYRINGE
INTRAMUSCULAR | Status: AC
  Filled 2021-03-28: qty 5

## 2021-03-28 MED ORDER — BUPIVACAINE-EPINEPHRINE (PF) 0.25% -1:200000 IJ SOLN
INTRAMUSCULAR | Status: DC | PRN
Start: 2021-03-28 — End: 2021-03-28
  Administered 2021-03-28: 30 mL via PERINEURAL

## 2021-03-28 MED ORDER — PROPOFOL 10 MG/ML IV BOLUS
INTRAVENOUS | Status: DC | PRN
  Administered 2021-03-28: 200 mg via INTRAVENOUS

## 2021-03-28 MED ORDER — ROCURONIUM BROMIDE 10 MG/ML (PF) SYRINGE
PREFILLED_SYRINGE | INTRAVENOUS | Status: AC
  Filled 2021-03-28: qty 10

## 2021-03-28 MED ORDER — DEXAMETHASONE SODIUM PHOSPHATE 10 MG/ML IJ SOLN: 8.0000 mg | Freq: Once | INTRAMUSCULAR | Status: DC

## 2021-03-28 MED ORDER — ACETAMINOPHEN 10 MG/ML IV SOLN
1000.0000 mg | Freq: Four times a day (QID) | INTRAVENOUS | Status: DC
  Administered 2021-03-28: 1000 mg via INTRAVENOUS
  Filled 2021-03-28: qty 100

## 2021-03-28 MED ORDER — DEXAMETHASONE SODIUM PHOSPHATE 10 MG/ML IJ SOLN
INTRAMUSCULAR | Status: DC | PRN
  Administered 2021-03-28: 10 mg via INTRAVENOUS

## 2021-03-28 MED ORDER — HYDROCODONE-ACETAMINOPHEN 5-325 MG PO TABS: 1.0000 | ORAL_TABLET | Freq: Four times a day (QID) | ORAL | 0 refills | Status: AC | PRN

## 2021-03-28 MED ORDER — ROCURONIUM BROMIDE 10 MG/ML (PF) SYRINGE
PREFILLED_SYRINGE | INTRAVENOUS | Status: DC | PRN
  Administered 2021-03-28: 40 mg via INTRAVENOUS

## 2021-03-28 MED ORDER — METHOCARBAMOL 500 MG PO TABS: 500.0000 mg | ORAL_TABLET | Freq: Four times a day (QID) | ORAL | 0 refills | Status: AC | PRN

## 2021-03-28 MED ORDER — EPHEDRINE SULFATE-NACL 50-0.9 MG/10ML-% IV SOSY
PREFILLED_SYRINGE | INTRAVENOUS | Status: DC | PRN
  Administered 2021-03-28 (×2): 5 mg via INTRAVENOUS

## 2021-03-28 MED ORDER — LABETALOL HCL 5 MG/ML IV SOLN
INTRAVENOUS | Status: DC | PRN
  Administered 2021-03-28: 5 mg via INTRAVENOUS

## 2021-03-28 SURGICAL SUPPLY — 35 items
BAG COUNTER SPONGE SURGICOUNT (BAG) IMPLANT
BNDG ELASTIC 6X5.8 VLCR STR LF (GAUZE/BANDAGES/DRESSINGS) IMPLANT
COVER SURGICAL LIGHT HANDLE (MISCELLANEOUS) ×2 IMPLANT
DRAPE INCISE IOBAN 66X45 STRL (DRAPES) ×2 IMPLANT
DRAPE OEC MINIVIEW 54X84 (DRAPES) ×2 IMPLANT
DRAPE ORTHO SPLIT 77X108 STRL (DRAPES) ×4
DRAPE SURG ORHT 6 SPLT 77X108 (DRAPES) ×2 IMPLANT
DRAPE U-SHAPE 47X51 STRL (DRAPES) ×2 IMPLANT
DRESSING MEPILEX FLEX 4X4 (GAUZE/BANDAGES/DRESSINGS) ×1 IMPLANT
DRSG ADAPTIC 3X8 NADH LF (GAUZE/BANDAGES/DRESSINGS) ×2 IMPLANT
DRSG MEPILEX BORDER 4X8 (GAUZE/BANDAGES/DRESSINGS) ×2 IMPLANT
DRSG MEPILEX FLEX 4X4 (GAUZE/BANDAGES/DRESSINGS) ×2
DURAPREP 26ML APPLICATOR (WOUND CARE) ×2 IMPLANT
ELECT REM PT RETURN 15FT ADLT (MISCELLANEOUS) ×2 IMPLANT
GLOVE SRG 8 PF TXTR STRL LF DI (GLOVE) ×3 IMPLANT
GLOVE SURG ENC MOIS LTX SZ6.5 (GLOVE) ×4 IMPLANT
GLOVE SURG ENC MOIS LTX SZ8 (GLOVE) ×6 IMPLANT
GLOVE SURG UNDER POLY LF SZ7 (GLOVE) ×4 IMPLANT
GLOVE SURG UNDER POLY LF SZ8 (GLOVE) ×6
GOWN STRL REUS W/TWL LRG LVL3 (GOWN DISPOSABLE) ×4 IMPLANT
KIT BASIN OR (CUSTOM PROCEDURE TRAY) ×2 IMPLANT
KIT TURNOVER KIT A (KITS) ×2 IMPLANT
MANIFOLD NEPTUNE II (INSTRUMENTS) ×2 IMPLANT
NS IRRIG 1000ML POUR BTL (IV SOLUTION) ×2 IMPLANT
PACK TOTAL JOINT (CUSTOM PROCEDURE TRAY) ×2 IMPLANT
PENCIL SMOKE EVACUATOR (MISCELLANEOUS) IMPLANT
PROTECTOR NERVE ULNAR (MISCELLANEOUS) ×2 IMPLANT
STAPLER VISISTAT 35W (STAPLE) IMPLANT
STRIP CLOSURE SKIN 1/2X4 (GAUZE/BANDAGES/DRESSINGS) ×2 IMPLANT
SUT MNCRL AB 4-0 PS2 18 (SUTURE) ×2 IMPLANT
SUT STRATAFIX PDS+ 0 24IN (SUTURE) ×2 IMPLANT
SUT VIC AB 2-0 CT1 27 (SUTURE) ×6
SUT VIC AB 2-0 CT1 TAPERPNT 27 (SUTURE) ×3 IMPLANT
TOWEL OR 17X26 10 PK STRL BLUE (TOWEL DISPOSABLE) ×2 IMPLANT
WATER STERILE IRR 1000ML POUR (IV SOLUTION) ×2 IMPLANT

## 2021-03-28 NOTE — Op Note (Signed)
NAMEELNORE, COSTER MEDICAL RECORD NO: [id number] ACCOUNT NO: [id number] DATE OF BIRTH: 1966-01-14 FACILITY: [facility] LOCATION: [facility] PHYSICIAN: Dione Plover. Roxana Lai, MD  Operative Report   DATE OF PROCEDURE: 03/28/2021  PREOPERATIVE DIAGNOSIS: Painful hardware, left hip.  POSTOPERATIVE DIAGNOSIS:  Painful hardware, left hip.  PROCEDURE:  Hardware removal, left hip.  SURGEON:  Gaynelle Arabian, MD  ASSISTANT:  Fenton Foy, PA-C  ANESTHESIA:  General.  ESTIMATED BLOOD LOSS:  25 mL.  DRAIN:  None.  COMPLICATION:  None.  CONDITION:  Stable to recovery.  BRIEF CLINICAL NOTE:  The patient is a 55 year old female who had a left total hip arthroplasty done approximately 15 years ago.  She has had persistent pain, worsening in the past year.  I saw her in second opinion a few months ago and noted that the  pain appeared to be emanating from a cable that had been placed at the time of her original surgery.  She presents now for hardware removal.  DESCRIPTION OF PROCEDURE:  After successful administration of general anesthetic, the patient was placed in the right lateral decubitus position with the left side up and held with the hip positioner.  Left lower extremity was isolated from the perineum  with plastic drapes and prepped and draped in the usual sterile fashion.  A small lateral incision was made centered over the greater trochanter, skin cut with a 10 blade through subcutaneous tissue to the fascia lata, which was incised in line with the  skin incision.  The cable was identified and cut with a wire cutter.  I was able to remove the posterior aspect of the cable easily.  Anteriorly, the cable was overgrown by bone as soon as it curved to the anterior cortex.  I was able to remove the cable  all the way to the bone.  I did not want to start disrupting the integrity of the bone to remove the cable that was buried within the bone.  I was able to get everything out that was exposed. The  bone surface was smooth.  We then copiously irrigated the  wound with saline solution.  Hemostasis was achieved with electrocautery.  20 mL of 0.25% Marcaine with epinephrine was injected into the subcutaneous tissues and the fascia lata.  Fascia lata was closed with running Stratafix suture.  Subcutaneous was  closed with interrupted 2-0 Vicryl, subcuticular running 4-0 Monocryl.  The incision was cleaned and dried and Steri-Strips and sterile dressing applied.  She was then awakened and transported to recovery in stable condition.   SHW D: 03/28/2021 8:24:10 am T: 03/28/2021 8:42:00 am  JOB: [id number]

## 2021-03-28 NOTE — Anesthesia Procedure Notes (Signed)
Procedure Name: Intubation Date/Time: 03/28/2021 7:20 AM Performed by: Cleda Daub, CRNA Pre-anesthesia Checklist: Patient identified, Emergency Drugs available, Suction available and Patient being monitored Patient Re-evaluated:Patient Re-evaluated prior to induction Oxygen Delivery Method: Circle system utilized Preoxygenation: Pre-oxygenation with 100% oxygen Induction Type: IV induction Ventilation: Mask ventilation without difficulty Laryngoscope Size: Mac and 4 Grade View: Grade I Tube type: Oral Tube size: 7.0 mm Number of attempts: 1 Airway Equipment and Method: Stylet and Oral airway Placement Confirmation: ETT inserted through vocal cords under direct vision, positive ETCO2 and breath sounds checked- equal and bilateral Secured at: 22 cm Tube secured with: Tape Dental Injury: Teeth and Oropharynx as per pre-operative assessment

## 2021-03-28 NOTE — Transfer of Care (Signed)
Immediate Anesthesia Transfer of Care Note  Patient: Karen Reese  Procedure(s) Performed: HARDWARE REMOVAL LEFT HIP (Left: Hip)  Patient Location: PACU  Anesthesia Type:General  Level of Consciousness: awake, alert , oriented and patient cooperative  Airway & Oxygen Therapy: Patient Spontanous Breathing and Patient connected to face mask oxygen  Post-op Assessment: Report given to RN and Post -op Vital signs reviewed and stable  Post vital signs: Reviewed and stable  Last Vitals:  Vitals Value Taken Time  BP 161/97 03/28/21 0901  Temp 37 C 03/28/21 0857  Pulse 106 03/28/21 0901  Resp 24 03/28/21 0901  SpO2 100 % 03/28/21 0901  Vitals shown include unvalidated device data.  Last Pain:  Vitals:   03/28/21 0558  TempSrc:   PainSc: 5          Complications: No notable events documented.

## 2021-03-28 NOTE — Anesthesia Postprocedure Evaluation (Signed)
Anesthesia Post Note  Patient: Karen Reese  Procedure(s) Performed: HARDWARE REMOVAL LEFT HIP (Left: Hip)     Patient location during evaluation: PACU Anesthesia Type: General Level of consciousness: awake and alert Pain management: pain level controlled Vital Signs Assessment: post-procedure vital signs reviewed and stable Respiratory status: spontaneous breathing, nonlabored ventilation, respiratory function stable and patient connected to nasal cannula oxygen Cardiovascular status: blood pressure returned to baseline and stable Postop Assessment: no apparent nausea or vomiting Anesthetic complications: no   No notable events documented.  Last Vitals:  Vitals:   03/28/21 0930 03/28/21 0948  BP: (!) 136/94 (!) 148/86  Pulse: 96 97  Resp: (!) 24 16  Temp: (!) 36.3 C (!) 36.4 C  SpO2: 96% 97%    Last Pain:  Vitals:   03/28/21 0948  TempSrc:   PainSc: 0-No pain                 Tiajuana Amass

## 2021-03-28 NOTE — H&P (Signed)
CC- Karen Reese is a 55 y.o. female who presents with left hip pain  Hip Pain: Patient complains of left hip pain. Onset of the symptoms was several years ago. Inciting event:  She had a left Total Hip Arthroplasty in 2005 and has had persistent lateral hip pain for several years. It has gotten progressively worse in the past year or two and she came to me for second opinion. It was felt that her pain was secondary to a cerclage cable around her proximal femur as her pain was emanating from this area and the remainder of her evaluation was normal. She presents today for hardware (cable) removal left hip.    Past Medical History:  Diagnosis Date   Anxiety    Arthritis    hip shoulder spine   Asthma    Dyspnea    GERD (gastroesophageal reflux disease)    History of kidney stones 2021   Pre-diabetes    Prediabetes     Past Surgical History:  Procedure Laterality Date   APPENDECTOMY  1993   DILATION AND CURETTAGE OF UTERUS  1988   TOTAL HIP ARTHROPLASTY  2005   TUBAL LIGATION      Prior to Admission medications   Medication Sig Start Date End Date Taking? Authorizing Provider  albuterol (VENTOLIN HFA) 108 (90 Base) MCG/ACT inhaler Inhale 1-2 puffs into the lungs every 6 (six) hours as needed for wheezing or shortness of breath. 01/28/20  Yes Faustino Congress, NP  CHLOROPHYLL PO Take 1 Dose by mouth daily.   Yes [provider]  escitalopram (LEXAPRO) 20 MG tablet Take 20 mg by mouth daily. 03/07/20  Yes [provider]  famotidine (PEPCID) 20 MG tablet Take 1 tablet (20 mg total) by mouth 2 (two) times daily. 05/10/17  Yes Isla Pence, MD  ibuprofen (ADVIL) 200 MG tablet Take 1,000 mg by mouth every 6 (six) hours as needed for fever, headache or mild pain.   Yes [provider]  LORazepam (ATIVAN) 0.5 MG tablet Take 0.5 mg 2 (two) times daily as needed by mouth for anxiety.   Yes [provider]  Multiple Vitamins-Minerals (ALIVE  MULTI-VITAMIN PO) Take 1 tablet by mouth daily.   Yes [provider]  Omega-3 Fatty Acids (OMEGA 3 PO) Take 1 capsule by mouth daily.   Yes [provider]  vitamin B-12 (CYANOCOBALAMIN) 1000 MCG tablet Take 1,000 mcg by mouth daily.   Yes [provider]  VITAMIN D PO Take 1 capsule by mouth daily.   Yes [provider]  Phenyleph-Doxylamine-DM-APAP (NYQUIL SEVERE COLD/FLU) 5-6.25-10-325 MG/15ML LIQD Take 15 mLs by mouth at bedtime as needed (sleep/cough).    [provider]    Physical Examination: General appearance - alert, well appearing, and in no distress Mental status - alert, oriented to person, place, and time Chest - clear to auscultation, no wheezes, rales or rhonchi, symmetric air entry Heart - normal rate, regular rhythm, normal S1, S2, no murmurs, rubs, clicks or gallops Abdomen - soft, nontender, nondistended, no masses or organomegaly Neurological - alert, oriented, normal speech, no focal findings or movement disorder noted  A left hip exam was performed. GENERAL: no acute distress SKIN: intact SWELLING: none WARMTH: no warmth TENDERNESS: maximal at a point just below the greater trochanter corresponding to the position of the cable ROM: normal STRENGTH: normal GAIT: antalgic  ASSESSMENT: Painful hardware left hip  Plan -Hardware removal left hip. Discussed in detail with the patient who elects to  proceed  Karen Reese. Karen Lizardo, MD    03/28/2021, 6:25 AM

## 2021-03-28 NOTE — Brief Op Note (Signed)
03/28/2021  8:20 AM  PATIENT:  Karen Reese  55 y.o. female  PRE-OPERATIVE DIAGNOSIS:  painful hardware left hip  POST-OPERATIVE DIAGNOSIS:  painful hardware left hip  PROCEDURE:  Procedure(s): HARDWARE REMOVAL LEFT HIP (Left)  SURGEON:  Surgeon(s) and Role:    Gaynelle Arabian, MD - Primary  PHYSICIAN ASSISTANT:   ASSISTANTS: Fenton Foy, PA-C   ANESTHESIA:   general  EBL:  25 ml  BLOOD ADMINISTERED:none  DRAINS: none   LOCAL MEDICATIONS USED:  MARCAINE     COUNTS:  YES  TOURNIQUET:  * No tourniquets in log *  DICTATION: .Other Dictation: Dictation Number [id number]  PLAN OF CARE: Discharge to home after PACU  PATIENT DISPOSITION:  PACU - hemodynamically stable.

## 2021-03-28 NOTE — Discharge Instructions (Signed)
Karen Arabian, MD [facility] [address] [place], [place] [address] [phone]  POSTOPERATIVE DIRECTIONS  BLOOD CLOT PREVENTION Take an 81 mg aspirin once a day for three weeks following surgery.  HOME CARE INSTRUCTIONS  Remove items at home which could result in a fall. This includes throw rugs or furniture in walking pathways.  ICE to the affected hip as frequently as 20-30 minutes an hour and then as needed for pain and swelling. Continue to use ice on the hip for pain and swelling from surgery. You may notice swelling that will progress down to the foot and ankle. This is normal after surgery. Elevate the leg when you are not up walking on it.   Continue to use the breathing machine which will help keep your temperature down.  It is common for your temperature to cycle up and down following surgery, especially at night when you are not up moving around and exerting yourself.  The breathing machine keeps your lungs expanded and your temperature down.  DIET You may resume your previous home diet once your are discharged from the hospital.  DRESSING / WOUND CARE / SHOWERING You have an adhesive waterproof bandage over the incision. Leave this in place until your first follow-up appointment. Once you remove this you will not need to place another bandage.  You may begin showering 3 days following surgery, but do not submerge the incision under water.  ACTIVITY Walk with your walker as instructed. Use the walker until you are comfortable transitioning to a cane. Walk with the cane in the opposite hand of the operative leg. You may discontinue the cane once you are comfortable and walking steadily. Avoid periods of inactivity such as sitting longer than an hour when not asleep. This helps prevent blood clots.  Do not drive a car for 6 weeks or until released by your surgeon.  Do not drive while taking narcotics.  WEIGHT  BEARING Weight bearing as tolerated with assist device (walker, cane, etc) as directed, use it as long as suggested by your surgeon or therapist, typically at least 4-6 weeks.  POSTOPERATIVE CONSTIPATION PROTOCOL Constipation - defined medically as fewer than three stools per week and severe constipation as less than one stool per week.  One of the most common issues patients have following surgery is constipation.  Even if you have a regular bowel pattern at home, your normal regimen is likely to be disrupted due to multiple reasons following surgery.  Combination of anesthesia, postoperative narcotics, change in appetite and fluid intake all can affect your bowels.  In order to avoid complications following surgery, here are some recommendations in order to help you during your recovery period.  Colace (docusate) - Pick up an over-the-counter form of Colace or another stool softener and take twice a day as long as you are requiring postoperative pain medications.  Take with a full glass of water daily.  If you experience loose stools or diarrhea, hold the colace until you stool forms back up.  If your symptoms do not get better within 1 week or if they get worse, check with your doctor. Dulcolax (bisacodyl) - Pick up over-the-counter and take as directed by the product packaging as needed to assist with the movement of your bowels.  Take with a full glass of water.  Use this product as needed if not relieved by Colace only.  MiraLax (polyethylene glycol) - Pick up over-the-counter to have on hand.  MiraLax  is a solution that will increase the amount of water in your bowels to assist with bowel movements.  Take as directed and can mix with a glass of water, juice, soda, coffee, or tea.  Take if you go more than two days without a movement.Do not use MiraLax more than once per day. Call your doctor if you are still constipated or irregular after using this medication for 7 days in a row.  If you continue  to have problems with postoperative constipation, please contact the office for further assistance and recommendations.  If you experience "the worst abdominal pain ever" or develop nausea or vomiting, please contact the office immediatly for further recommendations for treatment.  ITCHING  If you experience itching with your medications, try taking only a single pain pill, or even half a pain pill at a time.  You can also use Benadryl over the counter for itching or also to help with sleep.   MEDICATIONS See your medication summary on the "After Visit Summary" that the nursing staff will review with you prior to discharge.  You may have some home medications which will be placed on hold until you complete the course of blood thinner medication.  It is important for you to complete the blood thinner medication as prescribed by your surgeon.  Continue your approved medications as instructed at time of discharge.  PRECAUTIONS If you experience chest pain or shortness of breath - call 911 immediately for transfer to the hospital emergency department.  If you develop a fever greater that 101 F, purulent drainage from wound, increased redness or drainage from wound, foul odor from the wound/dressing, or calf pain - CONTACT YOUR SURGEON.                                                   FOLLOW-UP APPOINTMENTS Make sure you keep all of your appointments after your operation with your surgeon and caregivers. You should call the office at the above phone number and make an appointment for approximately two weeks after the date of your surgery or on the date instructed by your surgeon outlined in the "After Visit Summary".  RANGE OF MOTION AND STRENGTHENING EXERCISES  These exercises are designed to help you keep full movement of your hip joint. Follow your caregiver's or physical therapist's instructions. Perform all exercises about fifteen times, three times per day or as directed. Exercise both hips, even  if you have had only one joint replacement. These exercises can be done on a training (exercise) mat, on the floor, on a table or on a bed. Use whatever works the best and is most comfortable for you. Use music or television while you are exercising so that the exercises are a pleasant break in your day. This will make your life better with the exercises acting as a break in routine you can look forward to.  Lying on your back, slowly slide your foot toward your buttocks, raising your knee up off the floor. Then slowly slide your foot back down until your leg is straight again.  Lying on your back spread your legs as far apart as you can without causing discomfort.  Lying on your side, raise your upper leg and foot straight up from the floor as far as is comfortable. Slowly lower the leg and repeat.  Lying on your  back, tighten up the muscle in the front of your thigh (quadriceps muscles). You can do this by keeping your leg straight and trying to raise your heel off the floor. This helps strengthen the largest muscle supporting your knee.  Lying on your back, tighten up the muscles of your buttocks both with the legs straight and with the knee bent at a comfortable angle while keeping your heel on the floor.   POST-OPERATIVE OPIOID TAPER INSTRUCTIONS: It is important to wean off of your opioid medication as soon as possible. If you do not need pain medication after your surgery it is ok to stop day one. Opioids include: Codeine, Hydrocodone(Norco, Vicodin), Oxycodone(Percocet, oxycontin) and hydromorphone amongst others.  Long term and even short term use of opiods can cause: Increased pain response Dependence Constipation Depression Respiratory depression And more.  Withdrawal symptoms can include Flu like symptoms Nausea, vomiting And more Techniques to manage these symptoms Hydrate well Eat regular healthy meals Stay active Use relaxation techniques(deep breathing, meditating, yoga) Do  Not substitute Alcohol to help with tapering If you have been on opioids for less than two weeks and do not have pain than it is ok to stop all together.  Plan to wean off of opioids This plan should start within one week post op of your joint replacement. Maintain the same interval or time between taking each dose and first decrease the dose.  Cut the total daily intake of opioids by one tablet each day Next start to increase the time between doses. The last dose that should be eliminated is the evening dose.   MAKE SURE YOU:  Understand these instructions.  Get help right away if you are not doing well or get worse.    Pick up stool softner and laxative for home use following surgery while on pain medications. Do not submerge incision under water. Please use good hand washing techniques while changing dressing each day. May shower starting three days after surgery. Please use a clean towel to pat the incision dry following showers. Continue to use ice for pain and swelling after surgery. Do not use any lotions or creams on the incision until instructed by your surgeon.

## 2021-03-29 ENCOUNTER — Encounter (HOSPITAL_COMMUNITY): Payer: Self-pay | Admitting: Orthopedic Surgery

## 2021-05-29 ENCOUNTER — Other Ambulatory Visit: Payer: Self-pay

## 2021-05-29 ENCOUNTER — Encounter (HOSPITAL_BASED_OUTPATIENT_CLINIC_OR_DEPARTMENT_OTHER): Payer: Self-pay | Admitting: *Deleted

## 2021-05-29 NOTE — H&P (Signed)
Patient's anticipated LOS is less than 2 midnights, meeting these requirements: - Younger than 39 - Lives within 1 hour of care - Has a competent adult at home to recover with post-op recover - NO history of  - Chronic pain requiring opiods  - Diabetes  - Coronary Artery Disease  - Heart failure  - Heart attack  - Stroke  - DVT/VTE  - Cardiac arrhythmia  - Respiratory Failure/COPD  - Renal failure  - Anemia  - Advanced Liver disease     Karen Reese is an 55 y.o. female.    Chief Complaint: right shoulder pain   HPI: Pt is a 55 y.o. female complaining of right shoulder pain for multiple months. Pain had continually increased since the beginning. X-rays in the clinic show rotator cuff tear. Pt has tried various conservative treatments which have failed to alleviate their symptoms, including injections and therapy. Various options are discussed with the patient. Risks, benefits and expectations were discussed with the patient. Patient understand the risks, benefits and expectations and wishes to proceed with surgery.   PCP:  Cyril Loosen, NP  D/C Plans: Home  PMH: Past Medical History:  Diagnosis Date   Anxiety    Arthritis    hip shoulder spine   Asthma    Dyspnea    GERD (gastroesophageal reflux disease)    History of kidney stones 2021   Pre-diabetes    Prediabetes     PSH: Past Surgical History:  Procedure Laterality Date   [place] REMOVAL Left 03/28/2021   Procedure: HARDWARE REMOVAL LEFT HIP;  Surgeon: Gaynelle Arabian, MD;  Location: [facility];  Service: Orthopedics;  Laterality: Left;   TOTAL HIP ARTHROPLASTY  2005   TUBAL LIGATION      Social History:  reports that she has never smoked. She has never used smokeless tobacco. She reports current alcohol use. She reports that she does not use drugs.  Allergies:  Allergies  Allergen Reactions   Bee Venom Anaphylaxis, Hives and  Swelling   Fire Ant Anaphylaxis, Hives and Swelling   Monosodium Glutamate Shortness Of Breath and Diarrhea    *MSG*    Medications: No current facility-administered medications for this encounter.   Current Outpatient Medications  Medication Sig Dispense Refill   albuterol (VENTOLIN HFA) 108 (90 Base) MCG/ACT inhaler Inhale 1-2 puffs into the lungs every 6 (six) hours as needed for wheezing or shortness of breath. 18 g 0   CHLOROPHYLL PO Take 1 Dose by mouth daily.     escitalopram (LEXAPRO) 20 MG tablet Take 20 mg by mouth daily.     famotidine (PEPCID) 20 MG tablet Take 1 tablet (20 mg total) by mouth 2 (two) times daily. 30 tablet 0   HYDROcodone-acetaminophen (NORCO/VICODIN) 5-325 MG tablet Take 1 tablet by mouth every 6 (six) hours as needed for moderate pain or severe pain. 28 tablet 0   ibuprofen (ADVIL) 200 MG tablet Take 1,000 mg by mouth every 6 (six) hours as needed for fever, headache or mild pain.     LORazepam (ATIVAN) 0.5 MG tablet Take 0.5 mg 2 (two) times daily as needed by mouth for anxiety.     methocarbamol (ROBAXIN) 500 MG tablet Take 1 tablet (500 mg total) by mouth every 6 (six) hours as needed for muscle spasms. 40 tablet 0   Multiple Vitamins-Minerals (ALIVE MULTI-VITAMIN PO) Take 1 tablet by mouth daily.  Omega-3 Fatty Acids (OMEGA 3 PO) Take 1 capsule by mouth daily.     Phenyleph-Doxylamine-DM-APAP (NYQUIL SEVERE COLD/FLU) 5-6.25-10-325 MG/15ML LIQD Take 15 mLs by mouth at bedtime as needed (sleep/cough).     vitamin B-12 (CYANOCOBALAMIN) 1000 MCG tablet Take 1,000 mcg by mouth daily.     VITAMIN D PO Take 1 capsule by mouth daily.      No results found for this or any previous visit (from the past 48 hour(s)). No results found.  ROS: Pain with rom of the right upper extremity  Physical Exam: Alert and oriented 55 y.o. female in no acute distress Cranial nerves 2-12 intact Cervical spine: full rom with no tenderness, nv intact distally Chest: active  breath sounds bilaterally, no wheeze rhonchi or rales Heart: regular rate and rhythm, no murmur Abd: non tender non distended with active bowel sounds Hip is stable with rom  Right shoulder pain and weakness with rom Nv intact distally  Assessment/Plan Assessment: right shoulder rotator cuff tear  Plan:  Patient will undergo a right shoulder rotator cuff repair by Dr. Veverly Fells at [facility] Risks benefits and expectations were discussed with the patient. Patient understand risks, benefits and expectations and wishes to proceed. Preoperative templating of the joint replacement has been completed, documented, and submitted to the Operating Room personnel in order to optimize intra-operative equipment management.   Merla Riches PA-C, MPAS [facility] is now [employer] [facility] [address], [place], [place], [place] [address] Phone: [phone] [email or web address] Facebook  [employer]

## 2021-05-30 NOTE — Progress Notes (Signed)

## 2021-06-04 ENCOUNTER — Ambulatory Visit (HOSPITAL_BASED_OUTPATIENT_CLINIC_OR_DEPARTMENT_OTHER): Payer: 59 | Admitting: Anesthesiology

## 2021-06-04 ENCOUNTER — Other Ambulatory Visit: Payer: Self-pay

## 2021-06-04 ENCOUNTER — Ambulatory Visit (HOSPITAL_BASED_OUTPATIENT_CLINIC_OR_DEPARTMENT_OTHER)
Admission: RE | Admit: 2021-06-04 | Discharge: 2021-06-04 | Disposition: A | Discharge status: Home / Self Care | Payer: 59 | Source: Ambulatory Visit | Attending: Orthopedic Surgery | Admitting: Orthopedic Surgery

## 2021-06-04 ENCOUNTER — Encounter (HOSPITAL_BASED_OUTPATIENT_CLINIC_OR_DEPARTMENT_OTHER): Payer: Self-pay | Admitting: Orthopedic Surgery

## 2021-06-04 ENCOUNTER — Encounter (HOSPITAL_BASED_OUTPATIENT_CLINIC_OR_DEPARTMENT_OTHER)
Admission: RE | Disposition: A | Discharge status: Home / Self Care | Payer: Self-pay | Source: Ambulatory Visit | Attending: Orthopedic Surgery

## 2021-06-04 DIAGNOSIS — Z9102 Food additives allergy status: Secondary | ICD-10-CM | POA: Diagnosis not present

## 2021-06-04 DIAGNOSIS — Z91030 Bee allergy status: Secondary | ICD-10-CM | POA: Diagnosis not present

## 2021-06-04 DIAGNOSIS — X58XXXA Exposure to other specified factors, initial encounter: Secondary | ICD-10-CM | POA: Diagnosis not present

## 2021-06-04 DIAGNOSIS — M75101 Unspecified rotator cuff tear or rupture of right shoulder, not specified as traumatic: Secondary | ICD-10-CM | POA: Diagnosis not present

## 2021-06-04 DIAGNOSIS — S43431A Superior glenoid labrum lesion of right shoulder, initial encounter: Secondary | ICD-10-CM | POA: Insufficient documentation

## 2021-06-04 DIAGNOSIS — M19011 Primary osteoarthritis, right shoulder: Secondary | ICD-10-CM | POA: Insufficient documentation

## 2021-06-04 DIAGNOSIS — Z79899 Other long term (current) drug therapy: Secondary | ICD-10-CM | POA: Insufficient documentation

## 2021-06-04 HISTORY — PX: SHOULDER ARTHROSCOPY WITH OPEN ROTATOR CUFF REPAIR: SHX6092

## 2021-06-04 LAB — GLUCOSE, CAPILLARY
Glucose-Capillary: 124 mg/dL — ABNORMAL HIGH (ref 70–99)
Glucose-Capillary: 122 mg/dL — ABNORMAL HIGH (ref 70–99)

## 2021-06-04 SURGERY — ARTHROSCOPY, SHOULDER WITH REPAIR, ROTATOR CUFF, OPEN
Anesthesia: Regional | Site: Shoulder | Laterality: Right

## 2021-06-04 MED ORDER — BUPIVACAINE LIPOSOME 1.3 % IJ SUSP
INTRAMUSCULAR | Status: DC | PRN
  Administered 2021-06-04: 10 mL via PERINEURAL

## 2021-06-04 MED ORDER — ROCURONIUM BROMIDE 10 MG/ML (PF) SYRINGE
PREFILLED_SYRINGE | INTRAVENOUS | Status: DC | PRN
  Administered 2021-06-04: 80 mg via INTRAVENOUS

## 2021-06-04 MED ORDER — BUPIVACAINE-EPINEPHRINE 0.25% -1:200000 IJ SOLN
INTRAMUSCULAR | Status: DC | PRN
  Administered 2021-06-04: 7 mL

## 2021-06-04 MED ORDER — FENTANYL CITRATE (PF) 100 MCG/2ML IJ SOLN
INTRAMUSCULAR | Status: AC
  Filled 2021-06-04: qty 2

## 2021-06-04 MED ORDER — PHENYLEPHRINE HCL-NACL 20-0.9 MG/250ML-% IV SOLN
INTRAVENOUS | Status: DC | PRN
  Administered 2021-06-04: 40 ug/min via INTRAVENOUS

## 2021-06-04 MED ORDER — BUPIVACAINE HCL (PF) 0.5 % IJ SOLN
INTRAMUSCULAR | Status: DC | PRN
  Administered 2021-06-04: 15 mL via PERINEURAL

## 2021-06-04 MED ORDER — CEFAZOLIN SODIUM-DEXTROSE 2-4 GM/100ML-% IV SOLN
2.0000 g | INTRAVENOUS | Status: AC
  Administered 2021-06-04: 2 g via INTRAVENOUS

## 2021-06-04 MED ORDER — ACETAMINOPHEN 500 MG PO TABS
1000.0000 mg | ORAL_TABLET | Freq: Once | ORAL | Status: AC
  Administered 2021-06-04: 1000 mg via ORAL

## 2021-06-04 MED ORDER — LIDOCAINE 2% (20 MG/ML) 5 ML SYRINGE
INTRAMUSCULAR | Status: DC | PRN
  Administered 2021-06-04: 60 mg via INTRAVENOUS

## 2021-06-04 MED ORDER — ONDANSETRON HCL 4 MG/2ML IJ SOLN
INTRAMUSCULAR | Status: DC | PRN
  Administered 2021-06-04: 4 mg via INTRAVENOUS

## 2021-06-04 MED ORDER — OXYCODONE-ACETAMINOPHEN 5-325 MG PO TABS: 1.0000 | ORAL_TABLET | ORAL | 0 refills | Status: AC | PRN

## 2021-06-04 MED ORDER — PHENYLEPHRINE 40 MCG/ML (10ML) SYRINGE FOR IV PUSH (FOR BLOOD PRESSURE SUPPORT)
PREFILLED_SYRINGE | INTRAVENOUS | Status: DC | PRN
  Administered 2021-06-04 (×2): 80 ug via INTRAVENOUS

## 2021-06-04 MED ORDER — LACTATED RINGERS IV SOLN: INTRAVENOUS | Status: DC

## 2021-06-04 MED ORDER — KETOROLAC TROMETHAMINE 15 MG/ML IJ SOLN
INTRAMUSCULAR | Status: DC | PRN
  Administered 2021-06-04: 15 mg via INTRAVENOUS

## 2021-06-04 MED ORDER — DEXAMETHASONE SODIUM PHOSPHATE 10 MG/ML IJ SOLN
INTRAMUSCULAR | Status: DC | PRN
  Administered 2021-06-04: 5 mg via INTRAVENOUS

## 2021-06-04 MED ORDER — FENTANYL CITRATE (PF) 100 MCG/2ML IJ SOLN: 25.0000 ug | INTRAMUSCULAR | Status: DC | PRN

## 2021-06-04 MED ORDER — MIDAZOLAM HCL 2 MG/2ML IJ SOLN
INTRAMUSCULAR | Status: AC
  Filled 2021-06-04: qty 2

## 2021-06-04 MED ORDER — ONDANSETRON HCL 4 MG PO TABS: 4.0000 mg | ORAL_TABLET | Freq: Three times a day (TID) | ORAL | 0 refills | Status: AC | PRN

## 2021-06-04 MED ORDER — MIDAZOLAM HCL 2 MG/2ML IJ SOLN
2.0000 mg | Freq: Once | INTRAMUSCULAR | Status: AC
  Administered 2021-06-04: 1 mg via INTRAVENOUS

## 2021-06-04 MED ORDER — PROPOFOL 10 MG/ML IV BOLUS
INTRAVENOUS | Status: DC | PRN
  Administered 2021-06-04: 110 mg via INTRAVENOUS

## 2021-06-04 MED ORDER — FENTANYL CITRATE (PF) 100 MCG/2ML IJ SOLN
INTRAMUSCULAR | Status: DC | PRN
  Administered 2021-06-04: 50 ug via INTRAVENOUS

## 2021-06-04 MED ORDER — METHOCARBAMOL 500 MG PO TABS: 500.0000 mg | ORAL_TABLET | Freq: Four times a day (QID) | ORAL | 1 refills | Status: AC | PRN

## 2021-06-04 MED ORDER — ACETAMINOPHEN 500 MG PO TABS
ORAL_TABLET | ORAL | Status: AC
  Filled 2021-06-04: qty 2

## 2021-06-04 MED ORDER — FENTANYL CITRATE (PF) 100 MCG/2ML IJ SOLN
100.0000 ug | Freq: Once | INTRAMUSCULAR | Status: AC
  Administered 2021-06-04: 50 ug via INTRAVENOUS

## 2021-06-04 SURGICAL SUPPLY — 68 items
ANCH SUT 1.3 2 RBN BLU WHT (Anchor) ×2 IMPLANT
ANCH SUT 2 1.3X1 LD 1 STRN (Anchor) ×4 IMPLANT
ANCHOR ALL SUT RC W2 1.3 RIB (Anchor) ×3 IMPLANT
ANCHOR ALL-SUT FLEX 1.3 Y-KNOT (Anchor) ×6 IMPLANT
ANCHOR ALL-SUT RC W2 1.3 RIB (Anchor) ×2 IMPLANT
APL SKNCLS STERI-STRIP NONHPOA (GAUZE/BANDAGES/DRESSINGS)
BENZOIN TINCTURE PRP APPL 2/3 (GAUZE/BANDAGES/DRESSINGS) IMPLANT
BIT DRILL 1.3M DISPOSABLE (BIT) ×3 IMPLANT
BLADE AVERAGE 25X9 (BLADE) ×3 IMPLANT
BLADE CLIPPER SURG (BLADE) IMPLANT
BLADE EXCALIBUR 4.0X13 (MISCELLANEOUS) ×3 IMPLANT
BLADE SURG 10 STRL SS (BLADE) ×3 IMPLANT
BLADE SURG 15 STRL LF DISP TIS (BLADE) ×2 IMPLANT
BLADE SURG 15 STRL SS (BLADE) ×3
BURR OVAL 8 FLU 4.0X13 (MISCELLANEOUS) IMPLANT
BURR OVAL 8 FLU 5.0X13 (MISCELLANEOUS) IMPLANT
DECANTER SPIKE VIAL GLASS SM (MISCELLANEOUS) IMPLANT
DISSECTOR  3.8MM X 13CM (MISCELLANEOUS) ×3
DISSECTOR 3.8MM X 13CM (MISCELLANEOUS) ×2 IMPLANT
DRAPE IMP U-DRAPE 54X76 (DRAPES) ×3 IMPLANT
DRAPE INCISE IOBAN 66X45 STRL (DRAPES) ×3 IMPLANT
DRAPE STERI 35X30 U-POUCH (DRAPES) ×3 IMPLANT
DRAPE U-SHAPE 47X51 STRL (DRAPES) ×3 IMPLANT
DRAPE U-SHAPE 76X120 STRL (DRAPES) ×6 IMPLANT
DRSG EMULSION OIL 3X3 NADH (GAUZE/BANDAGES/DRESSINGS) ×3 IMPLANT
DURAPREP 26ML APPLICATOR (WOUND CARE) ×3 IMPLANT
ELECT NEEDLE TIP 2.8 STRL (NEEDLE) ×3 IMPLANT
ELECT REM PT RETURN 9FT ADLT (ELECTROSURGICAL) ×3
ELECTRODE REM PT RTRN 9FT ADLT (ELECTROSURGICAL) ×2 IMPLANT
GAUZE SPONGE 4X4 12PLY STRL (GAUZE/BANDAGES/DRESSINGS) ×3 IMPLANT
GLOVE SURG ORTHO LTX SZ7.5 (GLOVE) ×3 IMPLANT
GLOVE SURG POLYISO LF SZ7 (GLOVE) ×3 IMPLANT
GLOVE SURG UNDER POLY LF SZ7 (GLOVE) ×6 IMPLANT
GLOVE SURG UNDER POLY LF SZ7.5 (GLOVE) ×3 IMPLANT
GLOVE SURG UNDER POLY LF SZ8.5 (GLOVE) ×3 IMPLANT
GOWN STRL REUS W/ TWL LRG LVL3 (GOWN DISPOSABLE) ×6 IMPLANT
GOWN STRL REUS W/TWL LRG LVL3 (GOWN DISPOSABLE) ×9
NDL SUT 6 .5 CRC .975X.05 MAYO (NEEDLE) ×2 IMPLANT
NEEDLE MAYO 6 CRC TAPER PT (NEEDLE) IMPLANT
NEEDLE MAYO CATGUT SZ4 (NEEDLE) ×3 IMPLANT
NEEDLE MAYO TAPER (NEEDLE) ×3
NS IRRIG 1000ML POUR BTL (IV SOLUTION) IMPLANT
PACK ARTHROSCOPY DSU (CUSTOM PROCEDURE TRAY) ×3 IMPLANT
PACK BASIN DAY SURGERY FS (CUSTOM PROCEDURE TRAY) ×3 IMPLANT
PAD ORTHO SHOULDER 7X19 LRG (SOFTGOODS) ×3 IMPLANT
PENCIL SMOKE EVACUATOR (MISCELLANEOUS) ×3 IMPLANT
PORT APPOLLO RF 90DEGREE MULTI (SURGICAL WAND) ×3 IMPLANT
SLING ARM FOAM STRAP LRG (SOFTGOODS) IMPLANT
SPONGE T-LAP 4X18 ~~LOC~~+RFID (SPONGE) ×3 IMPLANT
STAPLER VISISTAT 35W (STAPLE) IMPLANT
STRIP CLOSURE SKIN 1/2X4 (GAUZE/BANDAGES/DRESSINGS) IMPLANT
SUCTION FRAZIER HANDLE 10FR (MISCELLANEOUS)
SUCTION TUBE FRAZIER 10FR DISP (MISCELLANEOUS) IMPLANT
SUT BONE WAX W31G (SUTURE) ×6 IMPLANT
SUT HI-FI 2 STRAND C-2 40 (SUTURE) ×6 IMPLANT
SUT MNCRL AB 4-0 PS2 18 (SUTURE) ×3 IMPLANT
SUT VIC AB 0 CT1 18XCR BRD 8 (SUTURE) IMPLANT
SUT VIC AB 0 CT1 27 (SUTURE) ×3
SUT VIC AB 0 CT1 27XBRD ANBCTR (SUTURE) ×2 IMPLANT
SUT VIC AB 0 CT1 8-18 (SUTURE)
SUT VIC AB 2-0 CT1 27 (SUTURE) ×3
SUT VIC AB 2-0 CT1 TAPERPNT 27 (SUTURE) ×2 IMPLANT
SUT VICRYL 0 CT-2 (SUTURE) ×3 IMPLANT
SYR BULB EAR ULCER 3OZ GRN STR (SYRINGE) ×3 IMPLANT
TOWEL GREEN STERILE FF (TOWEL DISPOSABLE) ×3 IMPLANT
TUBE CONNECTING 20X1/4 (TUBING) ×3 IMPLANT
TUBING ARTHROSCOPY IRRIG 16FT (MISCELLANEOUS) ×3 IMPLANT
YANKAUER SUCT BULB TIP NO VENT (SUCTIONS) ×3 IMPLANT

## 2021-06-04 NOTE — Anesthesia Procedure Notes (Signed)
Anesthesia Regional Block: Interscalene brachial plexus block   Pre-Anesthetic Checklist: , timeout performed,  Correct Patient, Correct Site, Correct Laterality,  Correct Procedure, Correct Position, site marked,  Risks and benefits discussed,  Surgical consent,  Pre-op evaluation,  At surgeon's request and post-op pain management  Laterality: Right  Prep: Maximum Sterile Barrier Precautions used, chloraprep       Needles:  Injection technique: Single-shot  Needle Type: Echogenic Stimulator Needle     Needle Length: 4cm  Needle Gauge: 22     Additional Needles:   Procedures:,,,, ultrasound used (permanent image in chart),,    Narrative:  Start time: 06/04/2021 12:40 PM End time: 06/04/2021 12:46 PM Injection made incrementally with aspirations every 5 mL.  Performed by: Personally  Anesthesiologist: Freddrick March, MD  Additional Notes: Monitors applied. No increased pain on injection. No increased resistance to injection. Injection made in 5cc increments. Good needle visualization. Patient tolerated procedure well.

## 2021-06-04 NOTE — Discharge Instructions (Addendum)
Ice to the shoulder constantly.  Keep the incision covered and clean and dry for one week, then ok to get it wet in the shower.  Do exercise as instructed five minutes several times per day, at least every other hour to prevent stiffness.  Do Pendulums to warm up, dangle your arm at your side and leaning over the right side, swing you arm in gentle small circles Do pillow slides. With you arm resting on a pillow across your right leg, slide your hand out toward your knee and back to your hip. Do gentle rotation exercises. With the pillow resting under your arm and your elbow bent at 90 degrees, rotate your forearm in to your body(hug) and then out away from your body(hitch hike) back and forth for a few minutes.    DO NOT reach behind your back or push up out of a chair with the operative arm.  Use a sling while you are up and around for comfort, may remove while seated but hug a pillow.  Keep pillow propped behind the operative elbow. You should always be able to look down and see your elbow at your side  Follow up with Dr Veverly Fells in two weeks in the office, call [phone] for appt  Post Anesthesia Home Care Instructions  Activity: Get plenty of rest for the remainder of the day. A responsible individual must stay with you for 24 hours following the procedure.  For the next 24 hours, DO NOT: -Drive a car -Paediatric nurse -Drink alcoholic beverages -Take any medication unless instructed by your physician -Make any legal decisions or sign important papers.  Meals: Start with liquid foods such as gelatin or soup. Progress to regular foods as tolerated. Avoid greasy, spicy, heavy foods. If nausea and/or vomiting occur, drink only clear liquids until the nausea and/or vomiting subsides. Call your physician if vomiting continues.  Special Instructions/Symptoms: Your throat may feel dry or sore from the anesthesia or the breathing tube placed in your throat during surgery. If this causes  discomfort, gargle with warm salt water. The discomfort should disappear within 24 hours.  If you had a scopolamine patch placed behind your ear for the management of post- operative nausea and/or vomiting:  1. The medication in the patch is effective for 72 hours, after which it should be removed.  Wrap patch in a tissue and discard in the trash. Wash hands thoroughly with soap and water. 2. You may remove the patch earlier than 72 hours if you experience unpleasant side effects which may include dry mouth, dizziness or visual disturbances. 3. Avoid touching the patch. Wash your hands with soap and water after contact with the patch.       Regional Anesthesia Blocks  1. Numbness or the inability to move the "blocked" extremity may last from 3-48 hours after placement. The length of time depends on the medication injected and your individual response to the medication. If the numbness is not going away after 48 hours, call your surgeon.  2. The extremity that is blocked will need to be protected until the numbness is gone and the  Strength has returned. Because you cannot feel it, you will need to take extra care to avoid injury. Because it may be weak, you may have difficulty moving it or using it. You may not know what position it is in without looking at it while the block is in effect.  3. For blocks in the legs and feet, returning to weight bearing  and walking needs to be done carefully. You will need to wait until the numbness is entirely gone and the strength has returned. You should be able to move your leg and foot normally before you try and bear weight or walk. You will need someone to be with you when you first try to ensure you do not fall and possibly risk injury.  4. Bruising and tenderness at the needle site are common side effects and will resolve in a few days.  5. Persistent numbness or new problems with movement should be communicated to the surgeon or the [facility] [phone] [place] [phone]).    Information for Discharge Teaching: EXPAREL (bupivacaine liposome injectable suspension)   Your surgeon or anesthesiologist gave you EXPAREL(bupivacaine) to help control your pain after surgery.  EXPAREL is a local anesthetic that provides pain relief by numbing the tissue around the surgical site. EXPAREL is designed to release pain medication over time and can control pain for up to 72 hours. Depending on how you respond to EXPAREL, you may require less pain medication during your recovery.  Possible side effects: Temporary loss of sensation or ability to move in the area where bupivacaine was injected. Nausea, vomiting, constipation Rarely, numbness and tingling in your mouth or lips, lightheadedness, or anxiety may occur. Call your doctor right away if you think you may be experiencing any of these sensations, or if you have other questions regarding possible side effects.  Follow all other discharge instructions given to you by your surgeon or nurse. Eat a healthy diet and drink plenty of water or other fluids.  If you return to the hospital for any reason within 96 hours following the administration of EXPAREL, it is important for health care providers to know that you have received this anesthetic. A teal colored band has been placed on your arm with the date, time and amount of EXPAREL you have received in order to alert and inform your health care providers. Please leave this armband in place for the full 96 hours following administration, and then you may remove the band.

## 2021-06-04 NOTE — Anesthesia Procedure Notes (Signed)
Procedure Name: Intubation Date/Time: 06/04/2021 2:31 PM Performed by: Janene Harvey, CRNA Pre-anesthesia Checklist: Patient identified, Emergency Drugs available, Suction available and Patient being monitored Patient Re-evaluated:Patient Re-evaluated prior to induction Oxygen Delivery Method: Circle system utilized Preoxygenation: Pre-oxygenation with 100% oxygen Induction Type: IV induction Ventilation: Mask ventilation without difficulty Laryngoscope Size: Mac and 4 Grade View: Grade II Tube type: Oral Tube size: 7.0 mm Number of attempts: 1 Airway Equipment and Method: Stylet and Oral airway Placement Confirmation: ETT inserted through vocal cords under direct vision, positive ETCO2 and breath sounds checked- equal and bilateral Secured at: 22 cm Tube secured with: Tape Dental Injury: Teeth and Oropharynx as per pre-operative assessment

## 2021-06-04 NOTE — Interval H&P Note (Signed)
History and Physical Interval Note:  06/04/2021 2:03 PM  Karen Reese  has presented today for surgery, with the diagnosis of right shoulder rotator cuff tear SLAP tear and degenerative joint disease.  The various methods of treatment have been discussed with the patient and family. After consideration of risks, benefits and other options for treatment, the patient has consented to  Procedure(s) with comments: SHOULDER ARTHROSCOPY WITH SUBACROMIAL DECOMPRESSION AND OPEN ROTATOR CUFF REPAIR, OPEN DISTAL CLAIVCLE RESECTION AND  BICEPS TENODESIS (Right) - with ISB as a surgical intervention.  The patient's history has been reviewed, patient examined, no change in status, stable for surgery.  I have reviewed the patient's chart and labs.  Questions were answered to the patient's satisfaction.     Karen Reese

## 2021-06-04 NOTE — Op Note (Signed)
NAMEANAIA, FRITH MEDICAL RECORD NO: [id number] ACCOUNT NO: [id number] DATE OF BIRTH: [birth year] FACILITY: [facility] LOCATION: [facility] PHYSICIAN: Doran Heater. Veverly Fells, MD  Operative Report   DATE OF PROCEDURE: 06/04/2021  PREOPERATIVE DIAGNOSES:  Right shoulder rotator cuff tear, superior labrum anterior and posterior tear and acromioclavicular  arthritis.  POSTOPERATIVE DIAGNOSES:  Right shoulder rotator cuff tear, superior labrum anterior and posterior tear and acromioclavicular arthritis.  PROCEDURE PERFORMED:  Right shoulder arthroscopy with extensive intra-articular debridement of torn superior labrum anterior to posterior, arthroscopic biceps tenotomy, arthroscopic subacromial decompression with CA ligament release followed by mini open  rotator cuff repair, open biceps tenodesis in the groove and open distal clavicle resection.  ATTENDING SURGEON:  Esmond Plants, MD  ASSISTANT:  Darol Destine, [place], who was scrubbed during the entire procedure, and necessary for satisfactory completion of surgery.  General anesthesia was used plus interscalene block.  ESTIMATED BLOOD LOSS:  Minimal.  FLUID REPLACEMENT: 1200 mL crystalloid.  Instrument counts were correct.  There were no complications.  Perioperative antibiotics were given.  INDICATIONS:  The patient is a 55 year old female with a history of worsening right shoulder pain and dysfunction secondary to rotator cuff tear, SLAP tear and AC arthritis.  Having failed an extended period of conservative treatment, the patient  presents for operative treatment to restore function and to eliminate pain.  Informed consent obtained.  DESCRIPTION OF PROCEDURE:  After an adequate level of anesthesia was achieved, the patient was positioned in the modified beach chair position.  Right shoulder correctly identified and sterilely prepped and draped in the usual manner.  Timeout called,  verifying correct patient, correct site.  We did examine  the patient's shoulder under anesthesia revealing full passive range of motion with no undue stiffness, no instability.  After sterile prep and drape and our exam and our timeout, we entered the  shoulder using standard arthroscopic portals including anterior, posterior and lateral portals.  We identified significant synovitis in the shoulder joint and tearing of the superior labrum.  The patient had a Buford complex with a sublabral hole.  We  debrided the superior labrum and did a tenotomy on the biceps tendon in the articular portion.  The remainder of the labrum was stable.  The anterior inferior, posterior inferior labrum was stable.  We did leave the middle glenohumeral ligament attached  anteriorly.  Subscap looked fairly normal.  The rotator cuff was torn.  This was a supraspinatus and infraspinatus tendon tear.  Teres minor was normal and the articular cartilage was normal.  After completion of our debridement of the superior labrum  and biceps tenotomy, we placed the scope in the subacromial space.  A thorough bursectomy was performed followed by an acromioplasty with a high speed bur, creating a type 1 acromial shape with release of the CA ligament.  Once we had that done, we went  ahead and inspected the rotator cuff.  The tear was easily visible from the bursal surface.  We had debrided all the way over to the [facility] joint.  At this point, we concluded the arthroscopic portion of the surgery.  We then made a small saber incision  overlying the AC joint.  Dissection down through subcutaneous tissues using needle tip Bovie.  We identified the deltotrapezial fascia and incised in line with distal clavicle.  Subperiosteal dissection of the distal clavicle was performed followed by  excision of distal 2-3 mm of bone using the oscillating saw.  We irrigated the interval.  We then  removed the hypertrophied capsule from the dorsal aspect of the acromion, made sure that anterior and posterior AC ligaments  were intact and then applied  bone wax to the cut end of the clavicle.  We irrigated again and removed excess bone wax and then went ahead and repaired the deltotrapezial fascia anatomically with 0 Vicryl suture followed by 2-0 Vicryl for subcutaneous closure and 4-0 Monocryl for  skin.  We addressed the rotator cuff tear and the biceps tenodesis through a single mini open incision starting at the anterolateral border of the acromion and extending distally about 4 cm in the raphae between the anterior and the lateral heads of the  deltoid.  Dissection down through subcutaneous tissues using Bovie.  We identified the fat stripe, demarcated the anterior and lateral heads of the deltoid and divided that with a needle tip Bovie, placed our Arthrex retractor in that interval.  We  identified the biceps tendon.  We delivered the biceps out of the biceps sheath.  We whipstitched that with #2 Hi-Fi suture to reinforce the tendon.  We then placed a single Y-Knot Flex anchor through the floor of the biceps groove, which we had prepared  by scraping the floor with a Soil scientist and getting down to bleeding bone to enhance healing.  We then brought the suture anchor up in a mattress fashion through the reinforced tendon and tying that flush down to the tunnel.  We took the  longitudinal whipstitch sutures up and through the rotator interval and tied over soft tissue bridge incorporating part of the subscap for a 90-degree turn and a secondary point of fixation.  At this point, having fixed the biceps, we went ahead and went  for the rotator cuff tear.  This was a full thickness tear involving the supraspinatus and anterior portion of the infraspinatus tendon.  We mobilized the tendon off the tuberosity.  There was some torn tissue that we removed with a knife and the  rongeur.  We then placed #2 Hi-Fi suture in the free edge of the tendon and used a Cobb elevator on both sides of the tendon to mobilize the tendon for  repair.  We were able to get that anatomically back into position.  We took a single anchor, which was  a Y-Knot RC anchor with ribbon and placed at the medial part of the rotator cuff footprint, which we had prepped with a rongeur and curette.  We then brought that up in a mattress fashion x2 to restore the medial part of the footprint. We took about 5  sutures from the lateral edge of the tendon down through drill holes and tied over the lateral bone bridge for an anatomic watertight repair.  We were pleased with getting that repair back anatomically in position and having a nice strong double row  repair. We did have a little dog ear posteriorly, which we removed and then oversewed with 0 Vicryl suture figure-of-eight, so we had a very smooth low-profile repair, at that point, we ranged the shoulder.  No impingement was noted and no relative  motion at the repair site.  We then repaired the deltoid anatomically with 0 Vicryl suture followed by 2-0 Vicryl for subcutaneous closure and 4-0 Monocryl for skin and portals.  Steri-Strips were applied followed by sterile dressing.  The patient  tolerated surgery well.   SHW D: 06/04/2021 4:29:50 pm T: 06/04/2021 11:00:00 pm  JOB: [id number]/ [id number]

## 2021-06-04 NOTE — Brief Op Note (Signed)
06/04/2021  4:22 PM  PATIENT:  Karen Reese  55 y.o. female  PRE-OPERATIVE DIAGNOSIS:  right shoulder rotator cuff tear, SLAP tear and degenerative joint disease AC joint  POST-OPERATIVE DIAGNOSIS:  right shoulder rotator cuff tear, SLAP tear and degenerative joint disease Ac joint  PROCEDURE:  Procedure(s) with comments: SHOULDER ARTHROSCOPY WITH SUBACROMIAL DECOMPRESSION AND OPEN ROTATOR CUFF REPAIR, OPEN DISTAL CLAIVCLE RESECTION AND  BICEPS TENODESIS (Right) - with ISB  SURGEON:  Surgeon(s) and Role:    Netta Cedars, MD - Primary  PHYSICIAN ASSISTANT:   ASSISTANTS: [place] Bruns, PA-C   ANESTHESIA:   regional and general  EBL:  50 mL   BLOOD ADMINISTERED:none  DRAINS: none   LOCAL MEDICATIONS USED:  MARCAINE     SPECIMEN:  No Specimen  DISPOSITION OF SPECIMEN:  N/A  COUNTS:  YES  TOURNIQUET:  * No tourniquets in log *  DICTATION: .Other Dictation: Dictation Number [id number]  PLAN OF CARE: Discharge to home after PACU  PATIENT DISPOSITION:  PACU - hemodynamically stable.   Delay start of Pharmacological VTE agent (>24hrs) due to surgical blood loss or risk of bleeding: not applicable

## 2021-06-04 NOTE — Progress Notes (Signed)
Assisted Dr. Lanetta Inch with right, ultrasound guided, interscalene  block. Side rails up, monitors on throughout procedure. See vital signs in flow sheet. Tolerated Procedure well.

## 2021-06-04 NOTE — Anesthesia Preprocedure Evaluation (Addendum)
Anesthesia Evaluation  Patient identified by MRN, date of birth, ID band Patient awake    Reviewed: Allergy & Precautions, NPO status , Patient's Chart, lab work & pertinent test results  Airway Mallampati: I  TM Distance: >3 FB Neck ROM: Full    Dental no notable dental hx. (+) Teeth Intact, Dental Advisory Given   Pulmonary shortness of breath, asthma ,    Pulmonary exam normal breath sounds clear to auscultation       Cardiovascular negative cardio ROS Normal cardiovascular exam Rhythm:Regular Rate:Normal     Neuro/Psych PSYCHIATRIC DISORDERS Anxiety negative neurological ROS     GI/Hepatic Neg liver ROS, GERD  Medicated and Controlled,  Endo/Other  negative endocrine ROS  Renal/GU negative Renal ROS  negative genitourinary   Musculoskeletal  (+) Arthritis ,   Abdominal   Peds  Hematology negative hematology ROS (+)   Anesthesia Other Findings   Reproductive/Obstetrics                           Anesthesia Physical Anesthesia Plan  ASA: 2  Anesthesia Plan: General and Regional   Post-op Pain Management:  Regional for Post-op pain   Induction: Intravenous  PONV Risk Score and Plan: 3 and Midazolam, Dexamethasone and Ondansetron  Airway Management Planned: Oral ETT and LMA  Additional Equipment:   Intra-op Plan:   Post-operative Plan: Extubation in OR  Informed Consent: I have reviewed the patients History and Physical, chart, labs and discussed the procedure including the risks, benefits and alternatives for the proposed anesthesia with the patient or authorized representative who has indicated his/her understanding and acceptance.     Dental advisory given  Plan Discussed with: CRNA  Anesthesia Plan Comments:         Anesthesia Quick Evaluation

## 2021-06-05 NOTE — Transfer of Care (Signed)
Immediate Anesthesia Transfer of Care Note  Patient: Karen Reese  Procedure(s) Performed: SHOULDER ARTHROSCOPY WITH SUBACROMIAL DECOMPRESSION AND OPEN ROTATOR CUFF REPAIR, OPEN DISTAL CLAIVCLE RESECTION AND  BICEPS TENODESIS (Right: Shoulder)  Patient Location: PACU  Anesthesia Type:GA combined with regional for post-op pain  Level of Consciousness: awake, alert  and oriented  Airway & Oxygen Therapy: Patient Spontanous Breathing and Patient connected to face mask oxygen  Post-op Assessment: Report given to RN and Post -op Vital signs reviewed and stable  Post vital signs: Reviewed and stable  Last Vitals:  Vitals Value Taken Time  BP 147/86 06/04/21 1757  Temp 37 C 06/04/21 1757  Pulse 94 06/04/21 1757  Resp 20 06/04/21 1757  SpO2 94 % 06/04/21 1757    Last Pain:  Vitals:   06/04/21 1757  TempSrc: Oral  PainSc: 0-No pain         Complications: No notable events documented.

## 2021-06-05 NOTE — Anesthesia Postprocedure Evaluation (Signed)
Anesthesia Post Note  Patient: Karen Reese  Procedure(s) Performed: SHOULDER ARTHROSCOPY WITH SUBACROMIAL DECOMPRESSION AND OPEN ROTATOR CUFF REPAIR, OPEN DISTAL CLAIVCLE RESECTION AND  BICEPS TENODESIS (Right: Shoulder)     Patient location during evaluation: PACU Anesthesia Type: Regional and General Level of consciousness: awake and alert Pain management: pain level controlled Vital Signs Assessment: post-procedure vital signs reviewed and stable Respiratory status: spontaneous breathing, nonlabored ventilation and respiratory function stable Cardiovascular status: blood pressure returned to baseline and stable Postop Assessment: no apparent nausea or vomiting Anesthetic complications: no   No notable events documented.  Last Vitals:  Vitals:   06/04/21 1715 06/04/21 1757  BP:  (!) 147/86  Pulse:  94  Resp: 20 20  Temp:  37 C  SpO2: 93% 94%    Last Pain:  Vitals:   06/04/21 1757  TempSrc: Oral  PainSc: 0-No pain                 Lynda Rainwater

## 2021-06-06 ENCOUNTER — Encounter (HOSPITAL_BASED_OUTPATIENT_CLINIC_OR_DEPARTMENT_OTHER): Payer: Self-pay | Admitting: Orthopedic Surgery

## 2021-06-06 MED ORDER — SODIUM CHLORIDE 0.9 % IR SOLN
Status: DC | PRN
  Administered 2021-06-04: 2000 mL

## 2021-09-18 DIAGNOSIS — M25512 Pain in left shoulder: Secondary | ICD-10-CM | POA: Diagnosis not present

## 2021-10-01 DIAGNOSIS — Z23 Encounter for immunization: Secondary | ICD-10-CM | POA: Diagnosis not present

## 2021-10-18 DIAGNOSIS — M25511 Pain in right shoulder: Secondary | ICD-10-CM | POA: Diagnosis not present

## 2021-10-30 DIAGNOSIS — Z4789 Encounter for other orthopedic aftercare: Secondary | ICD-10-CM | POA: Diagnosis not present

## 2021-11-08 DIAGNOSIS — M79641 Pain in right hand: Secondary | ICD-10-CM | POA: Diagnosis not present

## 2021-11-08 DIAGNOSIS — M79642 Pain in left hand: Secondary | ICD-10-CM | POA: Diagnosis not present

## 2021-11-08 DIAGNOSIS — S6982XA Other specified injuries of left wrist, hand and finger(s), initial encounter: Secondary | ICD-10-CM | POA: Diagnosis not present

## 2021-12-21 ENCOUNTER — Emergency Department (HOSPITAL_COMMUNITY)
Admission: EM | Admit: 2021-12-21 | Discharge: 2021-12-21 | Disposition: A | Discharge status: Home / Self Care | Payer: 59 | Attending: Emergency Medicine | Admitting: Emergency Medicine

## 2021-12-21 ENCOUNTER — Other Ambulatory Visit: Payer: Self-pay

## 2021-12-21 DIAGNOSIS — X500XXA Overexertion from strenuous movement or load, initial encounter: Secondary | ICD-10-CM | POA: Insufficient documentation

## 2021-12-21 DIAGNOSIS — Y990 Civilian activity done for income or pay: Secondary | ICD-10-CM | POA: Diagnosis not present

## 2021-12-21 DIAGNOSIS — S4991XA Unspecified injury of right shoulder and upper arm, initial encounter: Secondary | ICD-10-CM | POA: Diagnosis not present

## 2021-12-21 DIAGNOSIS — S46912A Strain of unspecified muscle, fascia and tendon at shoulder and upper arm level, left arm, initial encounter: Secondary | ICD-10-CM | POA: Diagnosis not present

## 2021-12-21 MED ORDER — IBUPROFEN 600 MG PO TABS: 600.0000 mg | ORAL_TABLET | Freq: Four times a day (QID) | ORAL | 0 refills | Status: DC | PRN

## 2021-12-21 MED ORDER — CYCLOBENZAPRINE HCL 10 MG PO TABS: 10.0000 mg | ORAL_TABLET | Freq: Two times a day (BID) | ORAL | 0 refills | Status: AC | PRN

## 2021-12-21 NOTE — Discharge Instructions (Addendum)
Please alternate between heat and ice to the base of your neck and shoulder for comfort.  Continue to take ibuprofen for pain relief and you may also take muscle relaxant at night to help with your symptoms.  Follow-up with your orthopedist doctor for further care. ?

## 2021-12-21 NOTE — ED Triage Notes (Addendum)
Pt. Stated, Im having left shoulder pain that started 3 weeks ago.Denies any injury ?

## 2021-12-21 NOTE — ED Provider Notes (Signed)
?[facility] ?Provider Note ? ? ?CSN: [id number] ?Arrival date & time: 12/21/21  0704 ? ?  ? ?History ? ?Chief Complaint  ?Patient presents with  ? Shoulder Pain  ? ? ?Karen Reese is a 56 y.o. female. ? ?The history is provided by the patient and medical records. No language interpreter was used.  ?Shoulder Pain ? ?56 year old female with significant history of prediabetes, GERD, kidney stone, anxiety, arthritis presenting complaining of shoulder pain.  Patient report for the past 3 weeks she has had recurrent pain about her left shoulder radiates to the base of her neck with some tightness and stiffness.  Pain is achy sharp sometimes with movement she feels like her shoulder and neck would "lock up".  She tries taking some ibuprofen at home with some improvement.  She denies any associated fever or chills no headache no chest pain or trouble breathing.  She denies any specific injury.  She did report ports having a right shoulder arthroscopy and open rotator cuff repair in October of last year and because she is right-hand dominant she believes she may have been overcompensating using her left arm.  She does perform regular lifting at work.  She denies any focal numbness or focal weakness.  Symptoms moderate in severity ? ?Home Medications ?Prior to Admission medications   ?Medication Sig Start Date End Date Taking? Authorizing Provider  ?albuterol (VENTOLIN HFA) 108 (90 Base) MCG/ACT inhaler Inhale 1-2 puffs into the lungs every 6 (six) hours as needed for wheezing or shortness of breath. 01/28/20   Faustino Congress, NP  ?escitalopram (LEXAPRO) 20 MG tablet Take 20 mg by mouth daily. 03/07/20   [provider]  ?famotidine (PEPCID) 20 MG tablet Take 1 tablet (20 mg total) by mouth 2 (two) times daily. 05/10/17   Isla Pence, MD  ?HYDROcodone-acetaminophen (NORCO/VICODIN) 5-325 MG tablet Take 1 tablet by mouth every 6 (six) hours as needed for moderate pain  or severe pain. 03/28/21 03/28/22  Edmisten, Ok Anis, PA  ?ibuprofen (ADVIL) 200 MG tablet Take 1,000 mg by mouth every 6 (six) hours as needed for fever, headache or mild pain.    [provider]  ?LORazepam (ATIVAN) 0.5 MG tablet Take 0.5 mg 2 (two) times daily as needed by mouth for anxiety.    [provider]  ?methocarbamol (ROBAXIN) 500 MG tablet Take 1 tablet (500 mg total) by mouth every 6 (six) hours as needed for muscle spasms. 03/28/21   Edmisten, Ok Anis, PA  ?methocarbamol (ROBAXIN) 500 MG tablet Take 1 tablet (500 mg total) by mouth every 6 (six) hours as needed for muscle spasms. 06/04/21   Netta Cedars, MD  ?Multiple Vitamins-Minerals (ALIVE MULTI-VITAMIN PO) Take 1 tablet by mouth daily.    [provider]  ?Omega-3 Fatty Acids (OMEGA 3 PO) Take 1 capsule by mouth daily.    [provider]  ?ondansetron (ZOFRAN) 4 MG tablet Take 1 tablet (4 mg total) by mouth every 8 (eight) hours as needed for nausea, vomiting or refractory nausea / vomiting. 06/04/21 06/04/22  Netta Cedars, MD  ?oxyCODONE-acetaminophen (PERCOCET) 5-325 MG tablet Take 1-2 tablets by mouth every 4 (four) hours as needed for severe pain. 06/04/21 06/04/22  Netta Cedars, MD  ?vitamin B-12 (CYANOCOBALAMIN) 1000 MCG tablet Take 1,000 mcg by mouth daily.    [provider]  ?VITAMIN D PO Take 1 capsule by mouth daily.    [provider]  ?   ? ?Allergies    ?  Bee venom, Fire ant, and Monosodium glutamate   ? ?Review of Systems   ?Review of Systems  ?All other systems reviewed and are negative. ? ?Physical Exam ?Updated Vital Signs ?BP (!) 156/98   Pulse 89   Temp 98.7 ?F (37.1 ?C) (Oral)   Resp 14   LMP 01/21/2017 (Approximate)   SpO2 99%  ?Physical Exam ?Vitals and nursing note reviewed.  ?Constitutional:   ?   General: She is not in acute distress. ?   Appearance: She is well-developed.  ?HENT:  ?   Head: Atraumatic.  ?Eyes:  ?   Conjunctiva/sclera: Conjunctivae normal.   ?Cardiovascular:  ?   Rate and Rhythm: Normal rate and regular rhythm.  ?   Pulses: Normal pulses.  ?   Heart sounds: Normal heart sounds.  ?Pulmonary:  ?   Effort: Pulmonary effort is normal.  ?Musculoskeletal:     ?   General: Tenderness (Tenderness to the base of cervical spine as well as bilateral trapezius muscle left greater than right without any overlying skin changes swelling or deformity noted.  Left shoulder with full range of motion.) present.  ?   Cervical back: Normal range of motion and neck supple.  ?Skin: ?   Findings: No rash.  ?Neurological:  ?   Mental Status: She is alert.  ?Psychiatric:     ?   Mood and Affect: Mood normal.  ? ? ?ED Results / Procedures / Treatments   ?Labs ?(all labs ordered are listed, but only abnormal results are displayed) ?Labs Reviewed - No data to display ? ?EKG ?None ? ?Radiology ?No results found. ? ?Procedures ?Procedures  ? ? ?Medications Ordered in ED ?Medications - No data to display ? ?ED Course/ Medical Decision Making/ A&P ?  ?                        ?Medical Decision Making ? ?BP (!) 156/98   Pulse 89   Temp 98.7 ?F (37.1 ?C) (Oral)   Resp 14   LMP 01/21/2017 (Approximate)   SpO2 99%  ? ?7:36 AM ?This is a 56 year old female presenting complaining of pain to the base of the neck and left shoulder ongoing for the past 3 weeks.  Pain is reproducible on exam and she reported having "stiffness".  Symptoms suggestive of torticollis.  I have considered cardiac etiology but felt that this is less likely.  I do not suspect any fracture or dislocation as patient denies any recent injury.  Skin are normal in appearance, low suspicion for infectious cause.  Since that suspect this is MSK pain, likely due to overcompensating because she does regular lifting and she is right-hand dominant.  Encourage patient to continue with RICE therapy, he denies, and also will provide muscle relaxants to use as needed.  Recommend patient to follow-up with orthopedist, Netta Cedars  for outpatient care.  Return precaution given ? ? ? ? ? ? ? ? ? ?Final Clinical Impression(s) / ED Diagnoses ?Final diagnoses:  ?Strain of left shoulder, initial encounter  ? ? ?Rx / DC Orders ?ED Discharge Orders   ? ? None  ? ?  ? ? ?  ?Domenic Moras, PA-C ?12/21/21 0740 ? ?  ?Tegeler, Gwenyth Allegra, MD ?12/21/21 [phone] ? ?

## 2022-09-24 ENCOUNTER — Encounter: Payer: Self-pay | Admitting: Obstetrics & Gynecology

## 2022-09-24 ENCOUNTER — Ambulatory Visit (INDEPENDENT_AMBULATORY_CARE_PROVIDER_SITE_OTHER): Payer: BC Managed Care – PPO | Admitting: Obstetrics & Gynecology

## 2022-09-24 VITALS — BP 136/87 | HR 76 | Ht 62.0 in | Wt 177.0 lb

## 2022-09-24 DIAGNOSIS — N950 Postmenopausal bleeding: Secondary | ICD-10-CM | POA: Diagnosis not present

## 2022-09-24 DIAGNOSIS — D259 Leiomyoma of uterus, unspecified: Secondary | ICD-10-CM

## 2022-09-24 NOTE — Progress Notes (Signed)
Pt has had some postmenopausal bleeding mid November.  Pt states she had what seemed like a regular cycle that lasted 5 days.  Last pap 2022 with [facility] -  normal results. Last mammo fall 2023 -  normal.

## 2022-09-24 NOTE — Progress Notes (Signed)
Patient ID: Karen Reese, female   DOB: 03-18-1966, 57 y.o.   MRN: [id number]  Chief Complaint  Patient presents with   New Patient (Initial Visit)   Postmenopausal bleeding HPI Karen Reese is a 57 y.o. female.  Postmenopausal since 2018. She had 5 days of vaginal bleeding 07/2022, after no bleeding since menopause. Occasional RLQ pain. She says imaging had shown a right sided fibroid  HPI  Past Medical History:  Diagnosis Date   Anxiety    Arthritis    hip shoulder spine   Asthma    Dyspnea    GERD (gastroesophageal reflux disease)    History of kidney stones 2021   Pre-diabetes    Prediabetes     Past Surgical History:  Procedure Laterality Date   APPENDECTOMY  1993   DILATION AND CURETTAGE OF UTERUS  1988   HARDWARE REMOVAL Left 03/28/2021   Procedure: HARDWARE REMOVAL LEFT HIP;  Surgeon: Gaynelle Arabian, MD;  Location: [facility];  Service: Orthopedics;  Laterality: Left;   SHOULDER ARTHROSCOPY WITH OPEN ROTATOR CUFF REPAIR Right 06/04/2021   Procedure: SHOULDER ARTHROSCOPY WITH SUBACROMIAL DECOMPRESSION AND OPEN ROTATOR CUFF REPAIR, OPEN DISTAL CLAIVCLE RESECTION AND  BICEPS TENODESIS;  Surgeon: Netta Cedars, MD;  Location: [facility];  Service: Orthopedics;  Laterality: Right;  with ISB   TOTAL HIP ARTHROPLASTY  2005   TUBAL LIGATION      Family History  Problem Relation Age of Onset   Hypertension Other    Hypertension Mother    Hypertension Sister     Social History Social History   Tobacco Use   Smoking status: Never   Smokeless tobacco: Never  Vaping Use   Vaping Use: Never used  Substance Use Topics   Alcohol use: Yes    Comment: social   Drug use: No    Allergies  Allergen Reactions   Bee Venom Anaphylaxis, Hives and Swelling   Fire Ant Anaphylaxis, Hives and Swelling   Monosodium Glutamate Shortness Of Breath and Diarrhea    *MSG*    Current Outpatient Medications  Medication Sig Dispense Refill   albuterol  (VENTOLIN HFA) 108 (90 Base) MCG/ACT inhaler Inhale 1-2 puffs into the lungs every 6 (six) hours as needed for wheezing or shortness of breath. 18 g 0   atorvastatin (LIPITOR) 10 MG tablet Take 10 mg by mouth daily.     cyclobenzaprine (FLEXERIL) 10 MG tablet Take 1 tablet (10 mg total) by mouth 2 (two) times daily as needed for muscle spasms. 20 tablet 0   escitalopram (LEXAPRO) 20 MG tablet Take 20 mg by mouth daily.     famotidine (PEPCID) 20 MG tablet Take 1 tablet (20 mg total) by mouth 2 (two) times daily. 30 tablet 0   ibuprofen (ADVIL) 600 MG tablet Take 1 tablet (600 mg total) by mouth every 6 (six) hours as needed. 30 tablet 0   LORazepam (ATIVAN) 0.5 MG tablet Take 0.5 mg 2 (two) times daily as needed by mouth for anxiety.     methocarbamol (ROBAXIN) 500 MG tablet Take 1 tablet (500 mg total) by mouth every 6 (six) hours as needed for muscle spasms. 40 tablet 0   Multiple Vitamins-Minerals (ALIVE MULTI-VITAMIN PO) Take 1 tablet by mouth daily.     Omega-3 Fatty Acids (OMEGA 3 PO) Take 1 capsule by mouth daily.     potassium chloride (KLOR-CON) 10 MEQ tablet Take 10 mEq by mouth daily.     triamcinolone cream (KENALOG) 0.1 %  Apply 1 application. topically 2 (two) times daily.     vitamin B-12 (CYANOCOBALAMIN) 1000 MCG tablet Take 1,000 mcg by mouth daily.     VITAMIN D PO Take 1 capsule by mouth daily.     methocarbamol (ROBAXIN) 500 MG tablet Take 1 tablet (500 mg total) by mouth every 6 (six) hours as needed for muscle spasms. 60 tablet 1   VICTOZA 18 MG/3ML SOPN Inject 3 mg into the skin daily. (Patient not taking: Reported on 09/24/2022)     No current facility-administered medications for this visit.    Review of Systems Review of Systems  Constitutional: Negative.   Respiratory: Negative.    Gastrointestinal:  Positive for abdominal pain (right side).  Genitourinary:  Positive for menstrual problem (PMP bleed) and pelvic pain (right side). Negative for vaginal bleeding,  vaginal discharge and vaginal pain.    Blood pressure 136/87, pulse 76, height '5\' 2"'$  (1.575 m), weight 177 lb (80.3 kg), last menstrual period 01/21/2017.  Physical Exam Physical Exam Constitutional:      Appearance: Normal appearance. She is not ill-appearing.  Cardiovascular:     Rate and Rhythm: Normal rate.  Skin:    General: Skin is warm and dry.  Neurological:     Mental Status: She is alert.  Psychiatric:        Mood and Affect: Mood normal.        Behavior: Behavior normal.     Data Reviewed MPRESSION: No ureteral calculi or hydronephrosis. Tiny punctate right lower pole stone. Left renal cyst. Right adnexal lesion may represent an ovarian lesion versus broad ligament leiomyoma, but is not significantly changed compared to CT from 01/20/2013. Evaluation is limited without contrast. Probable uterine fibroid. Fecal retention. Narrative  TECHNIQUE: Axial CT abdomen and pelvis without contrast.  COMPARISON:  01/20/2013 Indication: Right flank pain, hematuria FINDINGS:  CHEST: Lung bases clear.  ABDOMEN AND PELVIS: . Liver: Within normal limits. . Gallbladder and Bile ducts: Within normal limits. . Pancreas: Within normal limits. . Spleen: Within normal limits. . Adrenals: Within normal limits. . . GI tract: Fecal retention. Marland Kitchen Appendix: Appendectomy. . . Kidneys: Punctate right lower pole calculus. Left renal cysts. No hydronephrosis. . Ureters: Within normal limits. . Urinary bladder: Within normal limits. . Reproductive: 3.8 x 2.9 cm hyperdense structure in the right adnexa, unchanged compared to CT from 01/20/2013. Probable uterine fibroids. . . Vascular: Limited without contrast. Within normal limits. . . Peritoneum/Extraperitoneum: No free fluid or free air. . . Musculoskeletal: No acute osseous abnormality. {Plasty. Procedure Note  Graylin Shiver, MDR - 07/21/2014 Formatting of this note might be different from the original. TECHNIQUE: Axial CT  abdomen and pelvis without contrast.  COMPARISON:  01/20/2013 Indication: Right flank pain, hematuria FINDINGS:  CHEST: Lung bases clear.  ABDOMEN AND PELVIS: . Liver: Within normal limits. . Gallbladder and Bile ducts: Within normal limits. . Pancreas: Within normal limits. . Spleen: Within normal limits. . Adrenals: Within normal limits. . . GI tract: Fecal retention. Marland Kitchen Appendix: Appendectomy. . . Kidneys: Punctate right lower pole calculus. Left renal cysts. No hydronephrosis. . Ureters: Within normal limits. . Urinary bladder: Within normal limits. . Reproductive: 3.8 x 2.9 cm hyperdense structure in the right adnexa, unchanged compared to CT from 01/20/2013. Probable uterine fibroids. . . Vascular: Limited without contrast. Within normal limits. . . Peritoneum/Extraperitoneum: No free fluid or free air. . . Musculoskeletal: No acute osseous abnormality. {Plasty.   IMPRESSION: No ureteral calculi or hydronephrosis. Tiny punctate right  lower pole stone. Left renal cyst. Right adnexal lesion may represent an ovarian lesion versus broad ligament leiomyoma, but is not significantly changed compared to CT from 01/20/2013. Evaluation is limited without contrast. Probable uterine fibroid. Fecal retention. Exam End: 07/21/14 13:38   Specimen Collected: 07/21/14 16:18 Last Resulted: 07/21/14 16:23  Received From: [facility]  Result Received: 04/24/20 16:18    Assessment Postmenopausal bleeding - Plan: US PELVIC COMPLETE WITH TRANSVAGINAL  Uterine leiomyoma, unspecified location   Plan F/U after [place] to image endometrium and determine if an endometrial biopsy is indicated    Emeterio Reeve 09/24/2022, 2:51 PM

## 2022-10-09 ENCOUNTER — Inpatient Hospital Stay: Admission: RE | Admit: 2022-10-09 | Payer: BC Managed Care – PPO | Source: Ambulatory Visit

## 2023-05-14 ENCOUNTER — Encounter (HOSPITAL_COMMUNITY): Payer: Self-pay | Admitting: Emergency Medicine

## 2023-05-14 ENCOUNTER — Emergency Department (HOSPITAL_COMMUNITY)
Admission: EM | Admit: 2023-05-14 | Discharge: 2023-05-14 | Disposition: A | Discharge status: Home / Self Care | Payer: No Typology Code available for payment source | Attending: Emergency Medicine | Admitting: Emergency Medicine

## 2023-05-14 ENCOUNTER — Other Ambulatory Visit: Payer: Self-pay

## 2023-05-14 ENCOUNTER — Emergency Department (HOSPITAL_COMMUNITY): Payer: No Typology Code available for payment source

## 2023-05-14 DIAGNOSIS — M545 Low back pain, unspecified: Secondary | ICD-10-CM | POA: Insufficient documentation

## 2023-05-14 DIAGNOSIS — J45909 Unspecified asthma, uncomplicated: Secondary | ICD-10-CM | POA: Insufficient documentation

## 2023-05-14 DIAGNOSIS — M25511 Pain in right shoulder: Secondary | ICD-10-CM | POA: Insufficient documentation

## 2023-05-14 DIAGNOSIS — R519 Headache, unspecified: Secondary | ICD-10-CM | POA: Insufficient documentation

## 2023-05-14 DIAGNOSIS — M25512 Pain in left shoulder: Secondary | ICD-10-CM | POA: Diagnosis not present

## 2023-05-14 DIAGNOSIS — M25552 Pain in left hip: Secondary | ICD-10-CM | POA: Insufficient documentation

## 2023-05-14 DIAGNOSIS — Y92410 Unspecified street and highway as the place of occurrence of the external cause: Secondary | ICD-10-CM | POA: Insufficient documentation

## 2023-05-14 LAB — PREGNANCY, URINE: Preg Test, Ur: NEGATIVE

## 2023-05-14 MED ORDER — CYCLOBENZAPRINE HCL 10 MG PO TABS
10.0000 mg | ORAL_TABLET | Freq: Once | ORAL | Status: AC
  Administered 2023-05-14: 10 mg via ORAL
  Filled 2023-05-14: qty 1

## 2023-05-14 MED ORDER — TIZANIDINE HCL 4 MG PO TABS: 4.0000 mg | ORAL_TABLET | Freq: Four times a day (QID) | ORAL | 0 refills | Status: AC | PRN

## 2023-05-14 MED ORDER — IBUPROFEN 600 MG PO TABS: 600.0000 mg | ORAL_TABLET | Freq: Four times a day (QID) | ORAL | 0 refills | Status: AC | PRN

## 2023-05-14 MED ORDER — IBUPROFEN 200 MG PO TABS
600.0000 mg | ORAL_TABLET | Freq: Once | ORAL | Status: AC
  Administered 2023-05-14: 600 mg via ORAL
  Filled 2023-05-14: qty 3
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
